# Patient Record
Sex: Male | Born: 1985 | Race: Black or African American | Hispanic: No | Marital: Single
Health system: Southern US, Community
[De-identification: ages and names within clinical notes are randomized; demographics above are authoritative.]

## PROBLEM LIST (undated history)

## (undated) DIAGNOSIS — F1910 Other psychoactive substance abuse, uncomplicated: Secondary | ICD-10-CM

## (undated) DIAGNOSIS — F419 Anxiety disorder, unspecified: Secondary | ICD-10-CM

## (undated) DIAGNOSIS — S060XAA Concussion with loss of consciousness status unknown, initial encounter: Secondary | ICD-10-CM

## (undated) DIAGNOSIS — F29 Unspecified psychosis not due to a substance or known physiological condition: Secondary | ICD-10-CM

## (undated) DIAGNOSIS — S43006A Unspecified dislocation of unspecified shoulder joint, initial encounter: Secondary | ICD-10-CM

---

## 2011-05-10 ENCOUNTER — Emergency Department (HOSPITAL_COMMUNITY)
Admission: EM | Admit: 2011-05-10 | Discharge: 2011-05-10 | Disposition: A | Discharge status: Home / Self Care | Payer: Self-pay | Attending: Emergency Medicine | Admitting: Emergency Medicine

## 2011-05-10 ENCOUNTER — Emergency Department (HOSPITAL_BASED_OUTPATIENT_CLINIC_OR_DEPARTMENT_OTHER)
Admission: EM | Admit: 2011-05-10 | Discharge: 2011-05-10 | Disposition: A | Discharge status: Home / Self Care | Payer: Self-pay | Attending: Emergency Medicine | Admitting: Emergency Medicine

## 2011-05-10 ENCOUNTER — Encounter (HOSPITAL_COMMUNITY): Payer: Self-pay | Admitting: Emergency Medicine

## 2011-05-10 ENCOUNTER — Emergency Department (HOSPITAL_COMMUNITY): Payer: Self-pay

## 2011-05-10 ENCOUNTER — Encounter (HOSPITAL_BASED_OUTPATIENT_CLINIC_OR_DEPARTMENT_OTHER): Payer: Self-pay | Admitting: *Deleted

## 2011-05-10 DIAGNOSIS — N50819 Testicular pain, unspecified: Secondary | ICD-10-CM

## 2011-05-10 DIAGNOSIS — N509 Disorder of male genital organs, unspecified: Secondary | ICD-10-CM | POA: Insufficient documentation

## 2011-05-10 LAB — URINALYSIS, MICROSCOPIC ONLY
Bilirubin Urine: NEGATIVE
Hgb urine dipstick: NEGATIVE
Ketones, ur: NEGATIVE mg/dL
Specific Gravity, Urine: 1.02 (ref 1.005–1.030)
Urobilinogen, UA: 1 mg/dL (ref 0.0–1.0)
pH: 6 (ref 5.0–8.0)

## 2011-05-10 MED ORDER — TRAMADOL HCL 50 MG PO TABS: 50.0000 mg | ORAL_TABLET | Freq: Four times a day (QID) | ORAL | Status: AC | PRN

## 2011-05-10 NOTE — ED Notes (Signed)
MD at bedside. 

## 2011-05-10 NOTE — ED Notes (Signed)
Called and spoke to [place], Charity fundraiser at [employer] to give pt report.

## 2011-05-10 NOTE — ED Notes (Signed)
Pt alert, presents from [facility], accepted per Dr Patria Mane, pt here for ultrasound, resp even unlabored, skin pwd

## 2011-05-10 NOTE — ED Provider Notes (Signed)
This patient was transferred from one of our affiliated facilities for further evaluation of testicular pain.  On arrival the patient defers request for additional analgesics, notes that he is the same.  The patient's ultrasound results did not demonstrate acute pathology, these findings were discussed with the patient, who continued to remain in stable condition.  Absent acute findings, with unremarkable vital signs, with the patient's description of the traumatic history to the onset of his complaints, the patient was discharged in stable condition with return precautions, urology followup.  Gerhard Munch, MD 05/10/11 [phone]

## 2011-05-10 NOTE — ED Notes (Signed)
Pt c/o left testicle pain after running into a table yesterday morning. Denies hematuria

## 2011-05-10 NOTE — ED Notes (Signed)
Patient returned from US.

## 2011-05-10 NOTE — ED Provider Notes (Signed)
History     CSN: [id number]  Arrival date & time 05/10/11  7829   First MD Initiated Contact with Patient 05/10/11 0622      Chief Complaint  Patient presents with  . Testicle Pain    (Consider location/radiation/quality/duration/timing/severity/associated sxs/prior treatment) Patient is a 26 y.o. male presenting with testicular pain. The history is provided by the patient. No language interpreter was used.  Testicle Pain This is a new problem. The current episode started yesterday. The problem occurs constantly. The problem has not changed since onset.Pertinent negatives include no abdominal pain. The symptoms are aggravated by nothing. The symptoms are relieved by nothing.  Pain states he ran into the corner of a table at 1130 yesterday and has had pain since.  Denies pain prior to event.  Denies dysuria.  No penile discharge no hematuria.  No abdominal pain.    History reviewed. No pertinent past medical history.  History reviewed. No pertinent past surgical history.  History reviewed. No pertinent family history.  History  Substance Use Topics  . Smoking status: Current Everyday Smoker  . Smokeless tobacco: Not on file  . Alcohol Use: Yes      Review of Systems  Constitutional: Negative for fever.  Gastrointestinal: Negative for abdominal pain.  Genitourinary: Positive for testicular pain. Negative for dysuria, discharge and penile pain.  All other systems reviewed and are negative.    Allergies  Review of patient's allergies indicates no known allergies.  Home Medications   Current Outpatient Rx  Name Route Sig Dispense Refill  . TRAMADOL HCL 50 MG PO TABS Oral Take 1 tablet (50 mg total) by mouth every 6 (six) hours as needed for pain. 15 tablet 0    BP 133/67  Pulse 68  Temp(Src) 97.8 F (36.6 C) (Oral)  Resp 16  Ht 5\' 10"  (1.778 m)  Wt 200 lb (90.719 kg)  BMI 28.70 kg/m2  SpO2 100%  Physical Exam  Constitutional: He is oriented to person, place,  and time. He appears well-developed and well-nourished. No distress.  HENT:  Head: Normocephalic and atraumatic.  Eyes: Conjunctivae are normal. Pupils are equal, round, and reactive to light.  Neck: Normal range of motion. Neck supple.  Cardiovascular: Normal rate and regular rhythm.   Pulmonary/Chest: Effort normal and breath sounds normal.  Abdominal: Soft. Bowel sounds are normal. There is no tenderness. Hernia confirmed negative in the right inguinal area and confirmed negative in the left inguinal area.  Genitourinary: Penis normal. Right testis shows no mass. Cremasteric reflex is not absent on the right side. Left testis shows tenderness. Left testis shows no mass and no swelling. Cremasteric reflex is not absent on the left side.  Neurological: He is alert and oriented to person, place, and time.  Skin: Skin is warm and dry.  Psychiatric: He has a normal mood and affect.    ED Course  Procedures (including critical care time)   Labs Reviewed  URINALYSIS, WITH MICROSCOPIC  URINE CULTURE   No results found.   1. Testicle pain       MDM  Case d/w Dr. Patria Mane will send POV to [facility] ED for [place]       Bernarr Longsworth K Latavion Halls-Rasch, MD 05/10/11 [phone]

## 2011-05-10 NOTE — Discharge Instructions (Signed)
Your evaluation today did not demonstrate acute changes in the testicles, such as infection, cancer, torsion.  If you develop any new, or concerning changes in your condition, please followup with the urologist or return to emergency department for additional evaluation.  Both ibuprofen and Tylenol are appropriate pain medications.  You may also use ice packs relief.

## 2011-05-11 LAB — URINE CULTURE
Colony Count: NO GROWTH
Culture  Setup Time: 201304181720

## 2011-08-13 ENCOUNTER — Emergency Department (HOSPITAL_BASED_OUTPATIENT_CLINIC_OR_DEPARTMENT_OTHER)
Admission: EM | Admit: 2011-08-13 | Discharge: 2011-08-13 | Disposition: A | Discharge status: Home / Self Care | Payer: Self-pay | Attending: Emergency Medicine | Admitting: Emergency Medicine

## 2011-08-13 DIAGNOSIS — K0889 Other specified disorders of teeth and supporting structures: Secondary | ICD-10-CM

## 2011-08-13 DIAGNOSIS — K029 Dental caries, unspecified: Secondary | ICD-10-CM

## 2011-08-13 NOTE — ED Notes (Signed)
See paper charting 

## 2011-08-13 NOTE — ED Provider Notes (Signed)
Documentation performed on downtime forms.    Carlisle Beers Courtnei Ruddell, MD 08/13/11 0230

## 2012-01-18 ENCOUNTER — Emergency Department (HOSPITAL_BASED_OUTPATIENT_CLINIC_OR_DEPARTMENT_OTHER)
Admission: EM | Admit: 2012-01-18 | Discharge: 2012-01-18 | Disposition: A | Discharge status: Home / Self Care | Payer: Self-pay | Attending: Emergency Medicine | Admitting: Emergency Medicine

## 2012-01-18 ENCOUNTER — Encounter (HOSPITAL_BASED_OUTPATIENT_CLINIC_OR_DEPARTMENT_OTHER): Payer: Self-pay | Admitting: Emergency Medicine

## 2012-01-18 DIAGNOSIS — R220 Localized swelling, mass and lump, head: Secondary | ICD-10-CM | POA: Insufficient documentation

## 2012-01-18 DIAGNOSIS — R1310 Dysphagia, unspecified: Secondary | ICD-10-CM | POA: Insufficient documentation

## 2012-01-18 DIAGNOSIS — K117 Disturbances of salivary secretion: Secondary | ICD-10-CM | POA: Insufficient documentation

## 2012-01-18 DIAGNOSIS — K029 Dental caries, unspecified: Secondary | ICD-10-CM | POA: Insufficient documentation

## 2012-01-18 DIAGNOSIS — F17200 Nicotine dependence, unspecified, uncomplicated: Secondary | ICD-10-CM | POA: Insufficient documentation

## 2012-01-18 MED ORDER — PENICILLIN V POTASSIUM 500 MG PO TABS: 500.0000 mg | ORAL_TABLET | Freq: Four times a day (QID) | ORAL | Status: AC

## 2012-01-18 MED ORDER — HYDROCODONE-ACETAMINOPHEN 5-500 MG PO TABS: 1.0000 | ORAL_TABLET | Freq: Four times a day (QID) | ORAL | Status: DC | PRN

## 2012-01-18 NOTE — ED Provider Notes (Signed)
History     CSN: [id number]  Arrival date & time 01/18/12  0620   None     No chief complaint on file.   (Consider location/radiation/quality/duration/timing/severity/associated sxs/prior treatment) Patient is a 26 y.o. male presenting with tooth pain. The history is provided by the patient.  Dental PainPrimary symptoms do not include oral bleeding, fever, angioedema or cough. The symptoms began 5 to 7 days ago. The symptoms are worsening. The symptoms are new. The symptoms occur constantly.  Additional symptoms include: dental sensitivity to temperature. Additional symptoms do not include: gum swelling, trouble swallowing, pain with swallowing and excessive salivation. Medical issues include: smoking.    No past medical history on file.  No past surgical history on file.  No family history on file.  History  Substance Use Topics  . Smoking status: Current Every Day Smoker  . Smokeless tobacco: Not on file  . Alcohol Use: Yes      Review of Systems  Constitutional: Negative for fever.  HENT: Negative for trouble swallowing.   Respiratory: Negative for cough.   All other systems reviewed and are negative.    Allergies  Review of patient's allergies indicates no known allergies.  Home Medications   Current Outpatient Rx  Name  Route  Sig  Dispense  Refill  . HYDROCODONE-ACETAMINOPHEN 5-500 MG PO TABS   Oral   Take 1 tablet by mouth every 6 (six) hours as needed for pain.   10 tablet   0   . PENICILLIN V POTASSIUM 500 MG PO TABS   Oral   Take 1 tablet (500 mg total) by mouth 4 (four) times daily.   40 tablet   0     BP 118/70  Pulse 90  Temp 98.3 F (36.8 C) (Oral)  Resp 16  Ht 5\' 11"  (1.803 m)  Wt 200 lb (90.719 kg)  BMI 27.89 kg/m2  SpO2 99%  Physical Exam  Constitutional: He is oriented to person, place, and time. He appears well-developed and well-nourished. No distress.  HENT:  Head: Normocephalic and atraumatic.  Right Ear: External ear  normal.  Left Ear: External ear normal.  Mouth/Throat: Uvula is midline and oropharynx is clear and moist. Dental caries present.    Eyes: Conjunctivae normal are normal. Pupils are equal, round, and reactive to light.  Neck: Normal range of motion. Neck supple.  Cardiovascular: Normal rate and regular rhythm.   Pulmonary/Chest: Effort normal and breath sounds normal. He has no wheezes. He has no rales.  Abdominal: Soft. Bowel sounds are normal.  Musculoskeletal: Normal range of motion.  Neurological: He is alert and oriented to person, place, and time.  Skin: Skin is warm and dry.  Psychiatric: He has a normal mood and affect.    ED Course  Procedures (including critical care time)  Labs Reviewed - No data to display No results found.   1. Dental caries       MDM  Will prescribe antibiotics and pain meds.  Must follow up with a dentist for ongoing care. Patient verbalizes understanding and agrees to follow up        Diane Mochizuki Smitty Cords, MD 01/18/12 0630

## 2012-01-18 NOTE — ED Notes (Signed)
Pt reports left lower jaw tooth pain onset Tuesday past

## 2013-02-16 ENCOUNTER — Emergency Department (HOSPITAL_BASED_OUTPATIENT_CLINIC_OR_DEPARTMENT_OTHER)
Admission: EM | Admit: 2013-02-16 | Discharge: 2013-02-16 | Disposition: A | Discharge status: Home / Self Care | Payer: Self-pay | Attending: Emergency Medicine | Admitting: Emergency Medicine

## 2013-02-16 ENCOUNTER — Emergency Department (HOSPITAL_BASED_OUTPATIENT_CLINIC_OR_DEPARTMENT_OTHER): Payer: Self-pay

## 2013-02-16 ENCOUNTER — Encounter (HOSPITAL_BASED_OUTPATIENT_CLINIC_OR_DEPARTMENT_OTHER): Payer: Self-pay | Admitting: Emergency Medicine

## 2013-02-16 DIAGNOSIS — Z791 Long term (current) use of non-steroidal anti-inflammatories (NSAID): Secondary | ICD-10-CM | POA: Insufficient documentation

## 2013-02-16 DIAGNOSIS — M25519 Pain in unspecified shoulder: Secondary | ICD-10-CM

## 2013-02-16 DIAGNOSIS — F17200 Nicotine dependence, unspecified, uncomplicated: Secondary | ICD-10-CM | POA: Insufficient documentation

## 2013-02-16 DIAGNOSIS — S43006A Unspecified dislocation of unspecified shoulder joint, initial encounter: Secondary | ICD-10-CM | POA: Insufficient documentation

## 2013-02-16 DIAGNOSIS — X500XXA Overexertion from strenuous movement or load, initial encounter: Secondary | ICD-10-CM | POA: Insufficient documentation

## 2013-02-16 DIAGNOSIS — Y9389 Activity, other specified: Secondary | ICD-10-CM | POA: Insufficient documentation

## 2013-02-16 DIAGNOSIS — Y929 Unspecified place or not applicable: Secondary | ICD-10-CM | POA: Insufficient documentation

## 2013-02-16 HISTORY — DX: Unspecified dislocation of unspecified shoulder joint, initial encounter: S43.006A

## 2013-02-16 MED ORDER — NAPROXEN 375 MG PO TABS: 375.0000 mg | ORAL_TABLET | Freq: Two times a day (BID) | ORAL | Status: DC

## 2013-02-16 MED ORDER — TRAMADOL HCL 50 MG PO TABS: 50.0000 mg | ORAL_TABLET | Freq: Four times a day (QID) | ORAL | Status: DC | PRN

## 2013-02-16 NOTE — ED Notes (Signed)
Pt amb to room 11 with quick steady gait in nad. Pt smiling, states that he dislocated his shoulder this morning, pt relocated it himself, pt states he is having pain, rates at 7/10, pt states he has chronic pain with this shoulder "every day". Pt states he is here for pain medication. Denies any other injuries or c/o.

## 2013-02-16 NOTE — Discharge Instructions (Signed)
Arthralgia  Your caregiver has diagnosed you as suffering from an arthralgia. Arthralgia means there is pain in a joint. This can come from many reasons including:  · Bruising the joint which causes soreness (inflammation) in the joint.  · Wear and tear on the joints which occur as we grow older (osteoarthritis).  · Overusing the joint.  · Various forms of arthritis.  · Infections of the joint.  Regardless of the cause of pain in your joint, most of these different pains respond to anti-inflammatory drugs and rest. The exception to this is when a joint is infected, and these cases are treated with antibiotics, if it is a bacterial infection.  HOME CARE INSTRUCTIONS   · Rest the injured area for as long as directed by your caregiver. Then slowly start using the joint as directed by your caregiver and as the pain allows. Crutches as directed may be useful if the ankles, knees or hips are involved. If the knee was splinted or casted, continue use and care as directed. If an stretchy or elastic wrapping bandage has been applied today, it should be removed and re-applied every 3 to 4 hours. It should not be applied tightly, but firmly enough to keep swelling down. Watch toes and feet for swelling, bluish discoloration, coldness, numbness or excessive pain. If any of these problems (symptoms) occur, remove the ace bandage and re-apply more loosely. If these symptoms persist, contact your caregiver or return to this location.  · For the first 24 hours, keep the injured extremity elevated on pillows while lying down.  · Apply ice for 15-20 minutes to the sore joint every couple hours while awake for the first half day. Then 03-04 times per day for the first 48 hours. Put the ice in a plastic bag and place a towel between the bag of ice and your skin.  · Wear any splinting, casting, elastic bandage applications, or slings as instructed.  · Only take over-the-counter or prescription medicines for pain, discomfort, or fever as  directed by your caregiver. Do not use aspirin immediately after the injury unless instructed by your physician. Aspirin can cause increased bleeding and bruising of the tissues.  · If you were given crutches, continue to use them as instructed and do not resume weight bearing on the sore joint until instructed.  Persistent pain and inability to use the sore joint as directed for more than 2 to 3 days are warning signs indicating that you should see a caregiver for a follow-up visit as soon as possible. Initially, a hairline fracture (break in bone) may not be evident on X-rays. Persistent pain and swelling indicate that further evaluation, non-weight bearing or use of the joint (use of crutches or slings as instructed), or further X-rays are indicated. X-rays may sometimes not show a small fracture until a week or 10 days later. Make a follow-up appointment with your own caregiver or one to whom we have referred you. A radiologist (specialist in reading X-rays) may read your X-rays. Make sure you know how you are to obtain your X-ray results. Do not assume everything is normal if you do not hear from us.  SEEK MEDICAL CARE IF:  Bruising, swelling, or pain increases.  SEEK IMMEDIATE MEDICAL CARE IF:   · Your fingers or toes are numb or blue.  · The pain is not responding to medications and continues to stay the same or get worse.  · The pain in your joint becomes severe.  · You   develop a fever over 102° F (38.9° C).  · It becomes impossible to move or use the joint.  MAKE SURE YOU:   · Understand these instructions.  · Will watch your condition.  · Will get help right away if you are not doing well or get worse.  Document Released: 01/08/2005 Document Revised: 04/02/2011 Document Reviewed: 08/27/2007  [employer]® Patient Information ©2014 [employer].

## 2013-02-16 NOTE — ED Notes (Signed)
MD at bedside, patient preparing for discharge. 

## 2013-02-16 NOTE — ED Provider Notes (Signed)
CSN: [id number]     Arrival date & time 02/16/13  7829 History   First MD Initiated Contact with Patient 02/16/13 [phone]     Chief Complaint  Patient presents with  . Shoulder Pain   (Consider location/radiation/quality/duration/timing/severity/associated sxs/prior Treatment) HPI Comments: Patient presents with right shoulder pain. 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 The last episode before today was about a year ago. He states this morning he was reaching into the back of his car and it dislocated. He was able to relocate the shoulder himself. He's had some ongoing pain since that time his right shoulder. He states it's been happening periodically since he was in high school. He's not been following up with an orthopedic surgeon. He denies any numbness in his hand. He's had any other injuries.  Patient is a 27 y.o. male presenting with shoulder pain.  Shoulder Pain Pertinent negatives include no headaches.    Past Medical History  Diagnosis Date  . Shoulder dislocation    History reviewed. No pertinent past surgical history. History reviewed. No pertinent family history. History  Substance Use Topics  . Smoking status: Current Every Day Smoker  . Smokeless tobacco: Not on file  . Alcohol Use: Yes    Review of Systems  Constitutional: Negative for fever.  Gastrointestinal: Negative for nausea and vomiting.  Musculoskeletal: Positive for arthralgias and joint swelling. Negative for back pain and neck pain.  Skin: Negative for wound.  Neurological: Negative for weakness, numbness and headaches.    Allergies  Review of patient's allergies indicates no known allergies.  Home Medications   Current Outpatient Rx  Name  Route  Sig  Dispense  Refill  . HYDROcodone-acetaminophen (VICODIN) 5-500 MG per tablet   Oral   Take 1 tablet by mouth every 6 (six) hours as needed for pain.   10 tablet   0   . naproxen (NAPROSYN) 375 MG tablet    Oral   Take 1 tablet (375 mg total) by mouth 2 (two) times daily.   20 tablet   0   . traMADol (ULTRAM) 50 MG tablet   Oral   Take 1 tablet (50 mg total) by mouth every 6 (six) hours as needed.   15 tablet   0    BP 125/77  Pulse 69  Temp(Src) 98 F (36.7 C) (Oral)  Resp 18  Ht 5\' 11"  (1.803 m)  Wt 210 lb (95.255 kg)  BMI 29.30 kg/m2  SpO2 98% Physical Exam  Constitutional: He is oriented to person, place, and time. He appears well-developed and well-nourished.  HENT:  Head: Normocephalic and atraumatic.  Neck: Normal range of motion. Neck supple.  Cardiovascular: Normal rate.   Pulmonary/Chest: Effort normal.  Musculoskeletal: He exhibits edema and tenderness.  Mild swelling to right shoulder.  Moderate pain on ROM/palpation of shoulder.  Clinically seems that shoulder is well located.  Normal sensation, motor function in hand.  No pain to elbow.  Neurological: He is alert and oriented to person, place, and time.  Skin: Skin is warm and dry.  Psychiatric: He has a normal mood and affect.    ED Course  Procedures (including critical care time) Labs Review Labs Reviewed - No data to display Imaging Review Dg Shoulder Right  02/16/2013   CLINICAL DATA:  Shoulder dislocation  EXAM: RIGHT SHOULDER - 2+ VIEW  COMPARISON:  None.  FINDINGS: Glenohumeral joint is intact. No evidence of scapular fracture or humeral fracture. The acromioclavicular  joint is intact.  IMPRESSION: No acute osseous abnormality.   Electronically Signed   By: Genevive BiStewart  Edmunds M.D.   On: 02/16/2013 07:49    EKG Interpretation   None       MDM   1. Shoulder pain    No evidence of ongoing dislocation or bony injury on x-ray. Patient is neurologically intact. He was given a prescription for Naprosyn and Ultram. He was placed in a shoulder sling. He was given a referral to follow up with orthopedics.    Rolan BuccoMelanie Carvel Huskins, MD 02/16/13 [phone]

## 2013-11-15 ENCOUNTER — Encounter (HOSPITAL_BASED_OUTPATIENT_CLINIC_OR_DEPARTMENT_OTHER): Payer: Self-pay | Admitting: Emergency Medicine

## 2013-11-15 ENCOUNTER — Emergency Department (HOSPITAL_BASED_OUTPATIENT_CLINIC_OR_DEPARTMENT_OTHER)
Admission: EM | Admit: 2013-11-15 | Discharge: 2013-11-15 | Disposition: A | Discharge status: Home / Self Care | Payer: Self-pay | Attending: Emergency Medicine | Admitting: Emergency Medicine

## 2013-11-15 DIAGNOSIS — Z87828 Personal history of other (healed) physical injury and trauma: Secondary | ICD-10-CM | POA: Insufficient documentation

## 2013-11-15 DIAGNOSIS — K029 Dental caries, unspecified: Secondary | ICD-10-CM | POA: Insufficient documentation

## 2013-11-15 DIAGNOSIS — Z791 Long term (current) use of non-steroidal anti-inflammatories (NSAID): Secondary | ICD-10-CM | POA: Insufficient documentation

## 2013-11-15 DIAGNOSIS — Z720 Tobacco use: Secondary | ICD-10-CM | POA: Insufficient documentation

## 2013-11-15 MED ORDER — TRAMADOL HCL 50 MG PO TABS: 50.0000 mg | ORAL_TABLET | Freq: Four times a day (QID) | ORAL | Status: DC | PRN

## 2013-11-15 MED ORDER — AMOXICILLIN 500 MG PO CAPS: 500.0000 mg | ORAL_CAPSULE | Freq: Three times a day (TID) | ORAL | Status: DC

## 2013-11-15 NOTE — ED Notes (Signed)
Patient states he has left dental pain

## 2013-11-15 NOTE — Discharge Instructions (Signed)
Dental Pain °A tooth ache may be caused by cavities (tooth decay). Cavities expose the nerve of the tooth to air and hot or cold temperatures. It may come from an infection or abscess (also called a boil or furuncle) around your tooth. It is also often caused by dental caries (tooth decay). This causes the pain you are having. °DIAGNOSIS  °Your caregiver can diagnose this problem by exam. °TREATMENT  °· If caused by an infection, it may be treated with medications which kill germs (antibiotics) and pain medications as prescribed by your caregiver. Take medications as directed. °· Only take over-the-counter or prescription medicines for pain, discomfort, or fever as directed by your caregiver. °· Whether the tooth ache today is caused by infection or dental disease, you should see your dentist as soon as possible for further care. °SEEK MEDICAL CARE IF: °The exam and treatment you received today has been provided on an emergency basis only. This is not a substitute for complete medical or dental care. If your problem worsens or new problems (symptoms) appear, and you are unable to meet with your dentist, call or return to this location. °SEEK IMMEDIATE MEDICAL CARE IF:  °· You have a fever. °· You develop redness and swelling of your face, jaw, or neck. °· You are unable to open your mouth. °· You have severe pain uncontrolled by pain medicine. °MAKE SURE YOU:  °· Understand these instructions. °· Will watch your condition. °· Will get help right away if you are not doing well or get worse. °Document Released: 01/08/2005 Document Revised: 04/02/2011 Document Reviewed: 08/27/2007 °ExitCare® Patient Information ©2015 [employer]. This information is not intended to replace advice given to you by your health care provider. Make sure you discuss any questions you have with your health care provider. ° °Emergency Department Resource Guide °1) Find a Doctor and Pay Out of Pocket °Although you won't have to find out who  is covered by your insurance plan, it is a good idea to ask around and get recommendations. You will then need to call the office and see if the doctor you have chosen will accept you as a new patient and what types of options they offer for patients who are self-pay. Some doctors offer discounts or will set up payment plans for their patients who do not have insurance, but you will need to ask so you aren't surprised when you get to your appointment. ° °2) Contact Your Local Health Department °Not all health departments have doctors that can see patients for sick visits, but many do, so it is worth a call to see if yours does. If you don't know where your local health department is, you can check in your phone book. The CDC also has a tool to help you locate your state's health department, and many state websites also have listings of all of their local health departments. ° °3) Find a Walk-in Clinic °If your illness is not likely to be very severe or complicated, you may want to try a walk in clinic. These are popping up all over the country in pharmacies, drugstores, and shopping centers. They're usually staffed by nurse practitioners or physician assistants that have been trained to treat common illnesses and complaints. They're usually fairly quick and inexpensive. However, if you have serious medical issues or chronic medical problems, these are probably not your best option. ° °No Primary Care Doctor: °- Call [facility] at  832-8000 - they can help you locate a primary   care doctor that  accepts your insurance, provides certain services, etc. °- Physician Referral Service- 1-800-533-3463 ° °Chronic Pain Problems: °Organization         Address  Phone   Notes  °[facility]  (336) 297-2271 Patients need to be referred by their primary care doctor.  ° °Medication Assistance: °Organization         Address  Phone   Notes  °[place] [facility] [address]  [address] °[place], [place] [address] (336) 641-8030 --Must be a resident of [place] °-- Must have NO insurance coverage whatsoever (no Medicaid/ Medicare, etc.) °-- The pt. MUST have a primary care doctor that directs their care regularly and follows them in the community °  °[facility]  (866) 331-1348   °[employer]  (888) 892-1162   ° °Agencies that provide inexpensive medical care: °Organization         Address  Phone   Notes  °[facility]  (336) 832-8035   °[facility]    (336) 832-7272   °[facility] [address] °[place], [place] [address] (336) 832-4777   °[facility] [address], °[place] (336) 271-4999   °[facility]    (336) 373-0678   °[facility]    (336) 272-1050   °[facility] ° [address], [place] Phone:  (336) 832-4444, Fax:  (336) 832-4440 Hours of Operation:  9 am - 6 pm, M-F.  Also accepts Medicaid/Medicare and self-pay.  °[facility] ° [address], [place] Phone: (336) 832-3150, Fax: (336) 832-3151. Hours of Operation:  8:30 am - 5:30 pm, M-F.  Also accepts Medicaid and self-pay.  °[facility] [place] [address], [place] Phone: (336) 878-6027   °[facility] [address], [place], [place] (336)723-1848, Ext. 123 Mondays & Thursdays: 7-9 AM.  First 15 patients are seen on a first come, first serve basis. °  ° °Medicaid-accepting [place] Providers: ° °Organization         Address  Phone   Notes  °[facility] [address], [place] (336) 641-2100 Also accepts self-pay patients.  °[facility] [address], [place] ° (336) 856-9996   °[facility] [address] [place] [address], [place] (336) 288-8857   °[facility] [address] [place] [address], [place] (336) 299-7000   °Veita Bland [address], [place]  ° (336) 373-1557 Only accepts [place] Access Medicaid patients after they have their name applied to their card.  ° °Self-Pay (no insurance) in [place]: ° °Organization         Address  Phone   Notes  °Sickle Cell Patients, [facility] [address], [place] (336) 832-1970   °[facility] [address], [place] (336) 832-4400   °[facility] ° [address], [place] (336) 992-4800   °[facility]/Dr. Osei-Bonsu ° [address] [place] [address], [place] or [address], [place] (336) 841-8500 Phone number for both [place] and [place] locations is the same.  °[facility] [address], [place] (336) 299-0000   °[facility]  [address] [place] [address],  or [address] (336) 852-7530 °(336) 878-2260   °  [facility] [address], [place] (336) 350-1642, phone; (336) 294-5005, fax Sees patients 1st and 3rd Saturday of every month.  Must not qualify for public or private insurance (i.e. Medicaid, Medicare, [place] Health Choice, Veterans' Benefits) • Household income should be no more than 200% of the poverty level •The clinic cannot treat you if you are pregnant or think you are pregnant • Sexually transmitted diseases are not treated at the clinic.  ° ° °Dental Care: °Organization         Address  Phone  Notes  °[facility] [facility] [address], [place] (336) 641-6152 Accepts children up to age 21 who are enrolled in Medicaid or [place] Health Choice; pregnant women with a Medicaid card; and children who have applied for Medicaid or [place] Health Choice, but were declined, whose parents can pay a reduced fee at time of service.  °[facility] [place]  [address], [place] (336) 641-7733 Accepts children up to age 21 who are  enrolled in Medicaid or [place] Health Choice; pregnant women with a Medicaid card; and children who have applied for Medicaid or [place] Health Choice, but were declined, whose parents can pay a reduced fee at time of service.  °[facility] ° [address], [place] (336) 641-4533 Patients are seen by appointment only. Walk-ins are not accepted. [facility] will see patients 18 years of age and older. °Monday - Tuesday (8am-5pm) °Most Wednesdays (8:30-5pm) °$30 per visit, cash only  °[facility] ° [address], [place] (336) 641-4533 Patients are seen by appointment only. Walk-ins are not accepted. [facility] will see patients 18 years of age and older. °One Wednesday Evening (Monthly: Volunteer Based).  $30 per visit, cash only  °[facility]  (919) 537-3737 for adults; Children under age 4, call Graduate Pediatric Dentistry at ([phone]) [phone]. Children aged 4-14, please call (919) [phone] to request a pediatric application. ° Dental services are provided in all areas of dental care including fillings, crowns and bridges, complete and partial dentures, implants, gum treatment, root canals, and extractions. Preventive care is also provided. Treatment is provided to both adults and children. °Patients are selected via a lottery and there is often a waiting list. °  °[facility] [address], °[place] ° (336) 763-8833 [email or web address] °  °[facility] [address], [place], [place] (336)723-1848, Ext. 123 Second and Fourth Thursday of each month, opens at 6:30 AM; Clinic ends at 9 AM.  Patients are seen on a first-come first-served basis, and a limited number are seen during each clinic.  ° °[facility] ° [address], [place], [place] (336) 723-7904   Eligibility Requirements °You must have lived in [place], [place], or [place] counties for at least the last three months. °  You  cannot be eligible for state or federal sponsored healthcare insurance, including Veterans Administration, Medicaid, or Medicare. °  You generally cannot be eligible for healthcare insurance through your employer.  °  How to apply: °Eligibility screenings are held every Tuesday and Wednesday afternoon from 1:00 pm until 4:00 pm. You do not need an appointment for the interview!  °[facility] [address], [place], [place] 336-631-2330   °[facility]  336-342-8273   °[facility]  336-703-3100   ° [facility]  336-570-6415   ° °Behavioral Health Resources in the Community: °Intensive Outpatient Programs °Organization         Address  Phone  Notes  °[facility] [address], [place], [place] 336-878-6098   °[facility] [address], [place], [place] 336-832-9800   °[facility] [address], [place], [place] ° 336-882-2125   °[facility] [address],  °[place], [place] 1-800-853-5163 or 336-641-4981   °Substance Abuse Resources °Organization         Address  Phone  Notes  °[facility]  336-882-2125   °[facility]  336-784-9470   °[facility]  336-285-9073   °[facility]  336-845-3988   °Residential & Outpatient Substance Abuse Program  1-800-659-3381   °Psychological Services °Organization         Address  Phone  Notes  °[facility]  336- 832-9600   °[facility]  336- 378-7881   °[facility] [address], [place] 1-800-853-5163 or 336-641-4981   ° °Mobile Crisis Teams °Organization         Address  Phone  Notes  °[facility], Mobile Crisis Care Unit  1-877-626-1772   °Assertive °[facility] ° [address] [place], [place] 336-834-9664   °Sharon DeEsch [address] °[place] 336-554-5454   ° °Self-Help/Support  Groups °Organization         Address  Phone             Notes  °[facility] - variety of support groups  336- 373-1402 Call for more information  °Narcotics Anonymous (NA), [facility] [address], °[place]  2 meetings at this location  ° °Residential Treatment Programs °Organization         Address  Phone  Notes  °[facility] [address],    °[place]  1-866-801-8205   °[facility] ° [address], [place], [place] 704-293-8524   °[facility] [address], [place] 336-845-3988 Admissions: 8am-3pm M-F  °[facility] [address]    °[place], [place] 336-841-1104   °[facility] [address], [place], [place] 336-379-7146   °[facility] [address]  °[place], [place] 336-285-9073   °[facility] Intensive Outpatient [address], [place], [place] 336-852-3033   °[facility] [address]  °[place], [place] 1-877-615-2722 or 336-784-9470   °[facility] [address] [place], [place] 336-227-7417 Accepts Medicaid  °[facility] [address]  °[place] 1-800-659-3381 Substance Abuse/Addiction Treatment  ° °[place] Behavioral Health Resources °Organization         Address  Phone  Notes  °[facility]  (888) 581-9988   °Julie Brannon, PhD [address] [place], [place]   (336) 349-5553 or (336) 951-0000   °[facility]   [address] °[place], [place] (336) 349-4454   °[facility] [address], [place], [place] (336) 342-8316 Insurance/Medicaid/sponsorship through [facility]  °[facility] [address]                                    , [place] (336) 342-8316 Therapy/tele-psych/case  °[facility]   [address]  ° [place], [place] (336) 349-2233    °Dr. Arfeen  (336) 349-4544   °[facility] [place]  [employer] [facility] [place] [facility] 1) [address], [place] °2) [address], [place] °3)  [address], [place] (336) 349-3220 °(336) 342-7768 ° °(336) 342-8140   °[place] [facility] (336) 342-1394 or (336) 342-3537 (After Hours)    ° ° ° °

## 2013-11-15 NOTE — ED Provider Notes (Signed)
CSN: [id number]     Arrival date & time 11/15/13  1125 History   First MD Initiated Contact with Patient 11/15/13 1212     Chief Complaint  Patient presents with  . Dental Pain     (Consider location/radiation/quality/duration/timing/severity/associated sxs/prior Treatment) HPI Comments: Patient presents with a 2 day history of worsening pain to his left upper tooth. He denies any prior histories of similar discomfort there. He denies any facial swelling. There is no fevers or vomiting. He's been taking ibuprofen without relief.  Patient is a 28 y.o. male presenting with tooth pain.  Dental Pain Associated symptoms: no facial swelling, no fever and no headaches     Past Medical History  Diagnosis Date  . Shoulder dislocation    History reviewed. No pertinent past surgical history. No family history on file. History  Substance Use Topics  . Smoking status: Current Every Day Smoker  . Smokeless tobacco: Not on file  . Alcohol Use: Yes    Review of Systems  Constitutional: Negative for fever.  HENT: Positive for dental problem. Negative for facial swelling.   Gastrointestinal: Negative for nausea and vomiting.  Skin: Negative for wound.  Neurological: Negative for headaches.      Allergies  Review of patient's allergies indicates no known allergies.  Home Medications   Prior to Admission medications   Medication Sig Start Date End Date Taking? Authorizing Provider  amoxicillin (AMOXIL) 500 MG capsule Take 1 capsule (500 mg total) by mouth 3 (three) times daily. 11/15/13   Rolan BuccoMelanie Jalacia Mattila, MD  HYDROcodone-acetaminophen (VICODIN) 5-500 MG per tablet Take 1 tablet by mouth every 6 (six) hours as needed for pain. 01/18/12   April K Palumbo-Rasch, MD  naproxen (NAPROSYN) 375 MG tablet Take 1 tablet (375 mg total) by mouth 2 (two) times daily. 02/16/13   Rolan BuccoMelanie Roman Sandall, MD  traMADol (ULTRAM) 50 MG tablet Take 1 tablet (50 mg total) by mouth every 6 (six) hours as needed. 02/16/13    Rolan BuccoMelanie Alesandro Stueve, MD  traMADol (ULTRAM) 50 MG tablet Take 1 tablet (50 mg total) by mouth every 6 (six) hours as needed. 11/15/13   Rolan BuccoMelanie Bellina Tokarczyk, MD   BP 133/83  Pulse 69  Temp(Src) 98.3 F (36.8 C) (Oral)  Resp 18  Ht 5\' 11"  (1.803 m)  Wt 215 lb (97.523 kg)  BMI 30.00 kg/m2  SpO2 98% Physical Exam  Constitutional: He is oriented to person, place, and time. He appears well-developed and well-nourished.  HENT:  Positive tenderness to the left upper back molar. There is no facial swelling. No trismus. No induration or fluctuance around the tooth.  Cardiovascular: Normal rate.   Pulmonary/Chest: Effort normal.  Neurological: He is alert and oriented to person, place, and time.  Skin: Skin is warm and dry.    ED Course  Procedures (including critical care time) Labs Review Labs Reviewed - No data to display  Imaging Review No results found.   EKG Interpretation None      MDM   Final diagnoses:  Dental caries    Patient was started on amoxicillin and tramadol for pain. He was given a resource guide for possible outpatient follow-up with a dentist.    Rolan BuccoMelanie Margit Batte, MD 11/15/13 1251

## 2014-09-28 ENCOUNTER — Encounter (HOSPITAL_BASED_OUTPATIENT_CLINIC_OR_DEPARTMENT_OTHER): Payer: Self-pay | Admitting: Emergency Medicine

## 2014-09-28 ENCOUNTER — Emergency Department (HOSPITAL_BASED_OUTPATIENT_CLINIC_OR_DEPARTMENT_OTHER)
Admission: EM | Admit: 2014-09-28 | Discharge: 2014-09-28 | Disposition: A | Discharge status: Home / Self Care | Payer: Self-pay | Attending: Emergency Medicine | Admitting: Emergency Medicine

## 2014-09-28 DIAGNOSIS — Z720 Tobacco use: Secondary | ICD-10-CM | POA: Insufficient documentation

## 2014-09-28 DIAGNOSIS — M545 Low back pain: Secondary | ICD-10-CM | POA: Insufficient documentation

## 2014-09-28 DIAGNOSIS — R531 Weakness: Secondary | ICD-10-CM | POA: Insufficient documentation

## 2014-09-28 DIAGNOSIS — K529 Noninfective gastroenteritis and colitis, unspecified: Secondary | ICD-10-CM | POA: Insufficient documentation

## 2014-09-28 DIAGNOSIS — Z791 Long term (current) use of non-steroidal anti-inflammatories (NSAID): Secondary | ICD-10-CM | POA: Insufficient documentation

## 2014-09-28 DIAGNOSIS — Z792 Long term (current) use of antibiotics: Secondary | ICD-10-CM | POA: Insufficient documentation

## 2014-09-28 DIAGNOSIS — Z87828 Personal history of other (healed) physical injury and trauma: Secondary | ICD-10-CM | POA: Insufficient documentation

## 2014-09-28 LAB — CBC WITH DIFFERENTIAL/PLATELET
BASOS ABS: 0 10*3/uL (ref 0.0–0.1)
BASOS PCT: 1 % (ref 0–1)
EOS ABS: 0.1 10*3/uL (ref 0.0–0.7)
Eosinophils Relative: 2 % (ref 0–5)
HEMATOCRIT: 45.6 % (ref 39.0–52.0)
HEMOGLOBIN: 16 g/dL (ref 13.0–17.0)
Lymphocytes Relative: 37 % (ref 12–46)
Lymphs Abs: 2.9 10*3/uL (ref 0.7–4.0)
MCH: 31.5 pg (ref 26.0–34.0)
MCHC: 35.1 g/dL (ref 30.0–36.0)
MCV: 89.8 fL (ref 78.0–100.0)
Monocytes Absolute: 0.8 10*3/uL (ref 0.1–1.0)
Monocytes Relative: 11 % (ref 3–12)
NEUTROS ABS: 3.9 10*3/uL (ref 1.7–7.7)
NEUTROS PCT: 49 % (ref 43–77)
Platelets: 304 10*3/uL (ref 150–400)
RBC: 5.08 MIL/uL (ref 4.22–5.81)
RDW: 11.8 % (ref 11.5–15.5)
WBC: 7.8 10*3/uL (ref 4.0–10.5)

## 2014-09-28 LAB — URINE MICROSCOPIC-ADD ON

## 2014-09-28 LAB — COMPREHENSIVE METABOLIC PANEL
ALT: 17 U/L (ref 17–63)
AST: 15 U/L (ref 15–41)
Albumin: 4.4 g/dL (ref 3.5–5.0)
Alkaline Phosphatase: 40 U/L (ref 38–126)
Anion gap: 9 (ref 5–15)
BUN: 11 mg/dL (ref 6–20)
CHLORIDE: 104 mmol/L (ref 101–111)
CO2: 25 mmol/L (ref 22–32)
CREATININE: 1.19 mg/dL (ref 0.61–1.24)
Calcium: 9.6 mg/dL (ref 8.9–10.3)
GFR calc Af Amer: >60 mL/min (ref >60)
Glucose, Bld: 101 mg/dL — ABNORMAL HIGH (ref 65–99)
Potassium: 3.7 mmol/L (ref 3.5–5.1)
Sodium: 138 mmol/L (ref 135–145)
Total Bilirubin: 0.9 mg/dL (ref 0.3–1.2)
Total Protein: 7.7 g/dL (ref 6.5–8.1)

## 2014-09-28 LAB — URINALYSIS, ROUTINE W REFLEX MICROSCOPIC
BILIRUBIN URINE: NEGATIVE
GLUCOSE, UA: NEGATIVE mg/dL
Hgb urine dipstick: NEGATIVE
KETONES UR: 15 mg/dL — AB
Nitrite: NEGATIVE
PROTEIN: NEGATIVE mg/dL
SPECIFIC GRAVITY, URINE: 1.026 (ref 1.005–1.030)
Urobilinogen, UA: 1 mg/dL (ref 0.0–1.0)
pH: 5.5 (ref 5.0–8.0)

## 2014-09-28 LAB — LIPASE, BLOOD: LIPASE: 26 U/L (ref 22–51)

## 2014-09-28 MED ORDER — ONDANSETRON 8 MG PO TBDP: 8.0000 mg | ORAL_TABLET | Freq: Three times a day (TID) | ORAL | Status: AC | PRN

## 2014-09-28 MED ORDER — ONDANSETRON 4 MG PO TBDP
4.0000 mg | ORAL_TABLET | Freq: Once | ORAL | Status: AC | PRN
  Administered 2014-09-28: 4 mg via ORAL
  Filled 2014-09-28: qty 1

## 2014-09-28 NOTE — ED Notes (Signed)
Patient is tolerating ensure and coffee in triage, reports that this makes him nauseated.

## 2014-09-28 NOTE — ED Notes (Signed)
MD at bedside. 

## 2014-09-28 NOTE — ED Provider Notes (Signed)
CSN: [id number]     Arrival date & time 09/28/14  0017 History   First MD Initiated Contact with Patient 09/28/14 0214     Chief Complaint  Patient presents with  . Fatigue     (Consider location/radiation/quality/duration/timing/severity/associated sxs/prior Treatment) HPI  This is a 29 year old male who developed nausea, vomiting and diarrhea 4 days ago. He has not vomited or had diarrhea since the first day but continues to have no appetite and feels too nauseated to eat. He is only been able to tolerate Ensure. He is complaining of low back pain, generalized weakness and fatigue. He denies fever. Per nursing staff he has been lying in bed with a blanket over his head. He was given a liter of normal saline and 4 milligrams of Zofran prior to my evaluation with partial improvement in the symptoms. He states his abdomen hurts because he is presently hungry.  Past Medical History  Diagnosis Date  . Shoulder dislocation    History reviewed. No pertinent past surgical history. History reviewed. No pertinent family history. Social History  Substance Use Topics  . Smoking status: Current Every Day Smoker  . Smokeless tobacco: None  . Alcohol Use: Yes    Review of Systems  All other systems reviewed and are negative.  Allergies  Review of patient's allergies indicates no known allergies.  Home Medications   Prior to Admission medications   Medication Sig Start Date End Date Taking? Authorizing Provider  amoxicillin (AMOXIL) 500 MG capsule Take 1 capsule (500 mg total) by mouth 3 (three) times daily. 11/15/13   Rolan Bucco, MD  HYDROcodone-acetaminophen (VICODIN) 5-500 MG per tablet Take 1 tablet by mouth every 6 (six) hours as needed for pain. 01/18/12   April Palumbo, MD  naproxen (NAPROSYN) 375 MG tablet Take 1 tablet (375 mg total) by mouth 2 (two) times daily. 02/16/13   Rolan Bucco, MD  traMADol (ULTRAM) 50 MG tablet Take 1 tablet (50 mg total) by mouth every 6 (six) hours as  needed. 02/16/13   Rolan Bucco, MD  traMADol (ULTRAM) 50 MG tablet Take 1 tablet (50 mg total) by mouth every 6 (six) hours as needed. 11/15/13   Rolan Bucco, MD   BP 132/82 mmHg  Pulse 59  Temp(Src) 98.9 F (37.2 C) (Oral)  Resp 16  Ht 5\' 11"  (1.803 m)  Wt 220 lb (99.791 kg)  BMI 30.70 kg/m2  SpO2 99%   Physical Exam General: Well-developed, well-nourished male in no acute distress; appearance consistent with age of record HENT: normocephalic; atraumatic; mucous membranes moist Eyes: pupils equal, round and reactive to light; extraocular muscles intact Neck: supple Heart: regular rate and rhythm Lungs: clear to auscultation bilaterally Abdomen: soft; nondistended; nontender; no masses or hepatosplenomegaly; bowel sounds present Extremities: No deformity; full range of motion; pulses normal Neurologic: Awake, alert and oriented; motor function intact in all extremities and symmetric; no facial droop Skin: Warm and dry Psychiatric: Flat affect; poverty of speech    ED Course  Procedures (including critical care time)   MDM   Nursing notes and vitals signs, including pulse oximetry, reviewed.  Summary of this visit's results, reviewed by myself:  Labs:  Results for orders placed or performed during the hospital encounter of 09/28/14 (from the past 24 hour(s))  Lipase, blood     Status: None   Collection Time: 09/28/14 12:35 AM  Result Value Ref Range   Lipase 26 22 - 51 U/L  Comprehensive metabolic panel     Status: Abnormal  Collection Time: 09/28/14 12:35 AM  Result Value Ref Range   Sodium 138 135 - 145 mmol/L   Potassium 3.7 3.5 - 5.1 mmol/L   Chloride 104 101 - 111 mmol/L   CO2 25 22 - 32 mmol/L   Glucose, Bld 101 (H) 65 - 99 mg/dL   BUN 11 6 - 20 mg/dL   Creatinine, Ser 1.30 0.61 - 1.24 mg/dL   Calcium 9.6 8.9 - 86.5 mg/dL   Total Protein 7.7 6.5 - 8.1 g/dL   Albumin 4.4 3.5 - 5.0 g/dL   AST 15 15 - 41 U/L   ALT 17 17 - 63 U/L   Alkaline Phosphatase  40 38 - 126 U/L   Total Bilirubin 0.9 0.3 - 1.2 mg/dL   GFR calc non Af Amer >60 >60 mL/min   GFR calc Af Amer >60 >60 mL/min   Anion gap 9 5 - 15  Urinalysis, Routine w reflex microscopic (not at [facility])     Status: Abnormal   Collection Time: 09/28/14 12:35 AM  Result Value Ref Range   Color, Urine AMBER (A) YELLOW   APPearance CLEAR CLEAR   Specific Gravity, Urine 1.026 1.005 - 1.030   pH 5.5 5.0 - 8.0   Glucose, UA NEGATIVE NEGATIVE mg/dL   Hgb urine dipstick NEGATIVE NEGATIVE   Bilirubin Urine NEGATIVE NEGATIVE   Ketones, ur 15 (A) NEGATIVE mg/dL   Protein, ur NEGATIVE NEGATIVE mg/dL   Urobilinogen, UA 1.0 0.0 - 1.0 mg/dL   Nitrite NEGATIVE NEGATIVE   Leukocytes, UA SMALL (A) NEGATIVE  CBC with Differential     Status: None   Collection Time: 09/28/14 12:35 AM  Result Value Ref Range   WBC 7.8 4.0 - 10.5 K/uL   RBC 5.08 4.22 - 5.81 MIL/uL   Hemoglobin 16.0 13.0 - 17.0 g/dL   HCT 78.4 69.6 - 29.5 %   MCV 89.8 78.0 - 100.0 fL   MCH 31.5 26.0 - 34.0 pg   MCHC 35.1 30.0 - 36.0 g/dL   RDW 28.4 13.2 - 44.0 %   Platelets 304 150 - 400 K/uL   Neutrophils Relative % 49 43 - 77 %   Neutro Abs 3.9 1.7 - 7.7 K/uL   Lymphocytes Relative 37 12 - 46 %   Lymphs Abs 2.9 0.7 - 4.0 K/uL   Monocytes Relative 11 3 - 12 %   Monocytes Absolute 0.8 0.1 - 1.0 K/uL   Eosinophils Relative 2 0 - 5 %   Eosinophils Absolute 0.1 0.0 - 0.7 K/uL   Basophils Relative 1 0 - 1 %   Basophils Absolute 0.0 0.0 - 0.1 K/uL  Urine microscopic-add on     Status: None   Collection Time: 09/28/14 12:35 AM  Result Value Ref Range   Squamous Epithelial / LPF RARE RARE   WBC, UA 3-6 <3 WBC/hpf   RBC / HPF 0-2 <3 RBC/hpf   Bacteria, UA RARE RARE   Urine-Other MUCOUS PRESENT    3:05 AM Patient able to eat and drink without emesis.    Paula Libra, MD 09/28/14 [phone]

## 2014-09-28 NOTE — ED Notes (Signed)
Pt had not drunk his Sprite stating that the doctor had not brought him the crackers he said he'd bring.  MD stated that he did not say that but said it was OK to give pt crackers.  Saltine crackers given.

## 2014-09-28 NOTE — ED Notes (Signed)
Patient states that he threw up on Friday. He now states that he has been unable to eat any food but able to eat ensure. Is drinking ensure in triage. The patient reports that he is having low back pain, and just weak all over.

## 2017-06-29 ENCOUNTER — Other Ambulatory Visit: Payer: Self-pay

## 2017-06-29 ENCOUNTER — Encounter (HOSPITAL_BASED_OUTPATIENT_CLINIC_OR_DEPARTMENT_OTHER): Payer: Self-pay | Admitting: Emergency Medicine

## 2017-06-29 ENCOUNTER — Emergency Department (HOSPITAL_BASED_OUTPATIENT_CLINIC_OR_DEPARTMENT_OTHER): Payer: No Typology Code available for payment source

## 2017-06-29 ENCOUNTER — Emergency Department (HOSPITAL_BASED_OUTPATIENT_CLINIC_OR_DEPARTMENT_OTHER)
Admission: EM | Admit: 2017-06-29 | Discharge: 2017-06-29 | Disposition: A | Discharge status: Home / Self Care | Payer: No Typology Code available for payment source | Attending: Emergency Medicine | Admitting: Emergency Medicine

## 2017-06-29 DIAGNOSIS — Y999 Unspecified external cause status: Secondary | ICD-10-CM | POA: Insufficient documentation

## 2017-06-29 DIAGNOSIS — Y92410 Unspecified street and highway as the place of occurrence of the external cause: Secondary | ICD-10-CM | POA: Diagnosis not present

## 2017-06-29 DIAGNOSIS — M7918 Myalgia, other site: Secondary | ICD-10-CM | POA: Diagnosis present

## 2017-06-29 DIAGNOSIS — F17210 Nicotine dependence, cigarettes, uncomplicated: Secondary | ICD-10-CM | POA: Insufficient documentation

## 2017-06-29 DIAGNOSIS — Y9389 Activity, other specified: Secondary | ICD-10-CM | POA: Diagnosis not present

## 2017-06-29 MED ORDER — NAPROXEN 500 MG PO TABS: 500.0000 mg | ORAL_TABLET | Freq: Two times a day (BID) | ORAL | 0 refills | Status: DC

## 2017-06-29 MED ORDER — CYCLOBENZAPRINE HCL 10 MG PO TABS: 10.0000 mg | ORAL_TABLET | Freq: Two times a day (BID) | ORAL | 0 refills | Status: AC | PRN
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
## 2017-10-06 ENCOUNTER — Other Ambulatory Visit: Payer: Self-pay

## 2017-10-06 ENCOUNTER — Encounter (HOSPITAL_BASED_OUTPATIENT_CLINIC_OR_DEPARTMENT_OTHER): Payer: Self-pay | Admitting: Emergency Medicine

## 2017-10-06 ENCOUNTER — Emergency Department (HOSPITAL_BASED_OUTPATIENT_CLINIC_OR_DEPARTMENT_OTHER)
Admission: EM | Admit: 2017-10-06 | Discharge: 2017-10-06 | Disposition: A | Discharge status: Home / Self Care | Payer: Self-pay | Attending: Emergency Medicine | Admitting: Emergency Medicine

## 2017-10-06 ENCOUNTER — Emergency Department (HOSPITAL_BASED_OUTPATIENT_CLINIC_OR_DEPARTMENT_OTHER): Payer: Self-pay

## 2017-10-06 DIAGNOSIS — F17210 Nicotine dependence, cigarettes, uncomplicated: Secondary | ICD-10-CM | POA: Insufficient documentation

## 2017-10-06 DIAGNOSIS — R0602 Shortness of breath: Secondary | ICD-10-CM | POA: Insufficient documentation

## 2017-10-06 HISTORY — DX: Other psychoactive substance abuse, uncomplicated: F19.10

## 2017-10-06 LAB — COMPREHENSIVE METABOLIC PANEL
ALBUMIN: 4.5 g/dL (ref 3.5–5.0)
ALK PHOS: 50 U/L (ref 38–126)
ALT: 13 U/L (ref 0–44)
ANION GAP: 9 (ref 5–15)
AST: 13 U/L — ABNORMAL LOW (ref 15–41)
BUN: 6 mg/dL (ref 6–20)
CALCIUM: 9.4 mg/dL (ref 8.9–10.3)
CO2: 29 mmol/L (ref 22–32)
Chloride: 102 mmol/L (ref 98–111)
Creatinine, Ser: 0.97 mg/dL (ref 0.61–1.24)
GFR calc non Af Amer: >60 mL/min (ref >60)
GLUCOSE: 102 mg/dL — AB (ref 70–99)
POTASSIUM: 3.7 mmol/L (ref 3.5–5.1)
Sodium: 140 mmol/L (ref 135–145)
Total Bilirubin: 1 mg/dL (ref 0.3–1.2)
Total Protein: 7.8 g/dL (ref 6.5–8.1)

## 2017-10-06 LAB — CBC WITH DIFFERENTIAL/PLATELET
BASOS PCT: 1 %
Basophils Absolute: 0.1 10*3/uL (ref 0.0–0.1)
EOS ABS: 0.3 10*3/uL (ref 0.0–0.7)
EOS PCT: 4 %
HCT: 48.5 % (ref 39.0–52.0)
Hemoglobin: 17.7 g/dL — ABNORMAL HIGH (ref 13.0–17.0)
Lymphocytes Relative: 31 %
Lymphs Abs: 2.2 10*3/uL (ref 0.7–4.0)
MCH: 32.5 pg (ref 26.0–34.0)
MCHC: 36.5 g/dL — AB (ref 30.0–36.0)
MCV: 89.2 fL (ref 78.0–100.0)
MONOS PCT: 11 %
Monocytes Absolute: 0.8 10*3/uL (ref 0.1–1.0)
Neutro Abs: 3.8 10*3/uL (ref 1.7–7.7)
Neutrophils Relative %: 53 %
PLATELETS: 336 10*3/uL (ref 150–400)
RBC: 5.44 MIL/uL (ref 4.22–5.81)
RDW: 12.2 % (ref 11.5–15.5)
WBC: 7.1 10*3/uL (ref 4.0–10.5)

## 2017-10-06 LAB — TROPONIN I

## 2017-10-06 LAB — BRAIN NATRIURETIC PEPTIDE: B Natriuretic Peptide: 8.8 pg/mL (ref 0.0–100.0)

## 2017-10-06 LAB — D-DIMER, QUANTITATIVE (NOT AT ARMC)

## 2017-10-06 NOTE — Discharge Instructions (Addendum)
If you develop worsening or new concerning symptoms you can return to the emergency department for re-evaluation.  ° °

## 2017-10-06 NOTE — ED Notes (Signed)
Pt understood dc material. NAD Noted 

## 2017-10-06 NOTE — ED Notes (Signed)
When RN entered room pt was asleep with his arms above his head. Pt did not want to open eyes to talk to RN

## 2017-10-06 NOTE — ED Triage Notes (Signed)
States," When I don't use meth , I get SOB and feel funny" Last dose yesterday. Uses several times a week . Texting on phone , calm, while speaking to nurse

## 2017-10-06 NOTE — ED Provider Notes (Signed)
[facility] EMERGENCY DEPARTMENT Provider Note   CSN: [id number] Arrival date & time: 10/06/17  1144     History   Chief Complaint Chief Complaint  Patient presents with  . Drug Problem  . Shortness of Breath    HPI Jeff Yang is a 32 y.o. male with a history of drug abuse and tobacco abuse who presents emergency department today for shortness of breath.  Patient reports that this morning when he awoke he had shortness of breath at rest that was not worsened with exertion or when lying down.  He reports he typically gets these symptoms whenever he does not use meth.  He states that he typically uses meth approximately 3-4 times per week.  He notes his last dose was yesterday.  He states he rarely ingests meth and does not use any IV drug use.  He states he also has some dizziness this morning that he describes as feeling of unsteadiness.  He denies any vertigo. This has since resolved. No headache, visual changes, fever, neck stiffness, facial droop, difficulty with speech, focal weakness or difficulty with gait.  Patient states that nothing makes his symptoms better or worse.  He denies any associated fever, chills, cough, hemoptysis, chest pain, shortness of breath is worse with deep inspiration, lower extremity swelling or rash.  Patient is a very poor historian.  He notes that he smokes approximately 3 black and milds per day.  No history of COPD, CHF or asthma.    HPI  Past Medical History:  Diagnosis Date  . Drug abuse (HCC)   . Shoulder dislocation     There are no active problems to display for this patient.   History reviewed. No pertinent surgical history.      Home Medications    Prior to Admission medications   Medication Sig Start Date End Date Taking? Authorizing Provider  cyclobenzaprine (FLEXERIL) 10 MG tablet Take 1 tablet (10 mg total) by mouth 2 (two) times daily as needed for muscle spasms. 06/29/17   Khatri, Hina, PA-C  naproxen (NAPROSYN) 500  MG tablet Take 1 tablet (500 mg total) by mouth 2 (two) times daily. 06/29/17   Khatri, Hina, PA-C  ondansetron (ZOFRAN ODT) 8 MG disintegrating tablet Take 1 tablet (8 mg total) by mouth every 8 (eight) hours as needed for nausea or vomiting. 09/28/14   Molpus, John, MD    Family History No family history on file.  Social History Social History   Tobacco Use  . Smoking status: Current Some Day Smoker  . Smokeless tobacco: Never Used  Substance Use Topics  . Alcohol use: Yes    Comment: occ  . Drug use: Yes    Types: Methamphetamines     Allergies   Patient has no known allergies.   Review of Systems Review of Systems  All other systems reviewed and are negative.    Physical Exam Updated Vital Signs BP 138/89 (BP Location: Left Arm)   Pulse 87   Temp 98.6 F (37 C) (Oral)   Resp 16   Wt 85.3 kg   SpO2 98%   BMI 25.50 kg/m   Physical Exam  Constitutional: He appears well-developed and well-nourished.  HENT:  Head: Normocephalic and atraumatic.  Right Ear: External ear normal.  Left Ear: External ear normal.  Nose: Nose normal.  Mouth/Throat: Uvula is midline, oropharynx is clear and moist and mucous membranes are normal. No tonsillar exudate.  Eyes: Pupils are equal, round, and reactive to light. Right  eye exhibits no discharge. Left eye exhibits no discharge. No scleral icterus.  Neck: Trachea normal. Neck supple. No spinous process tenderness present. No neck rigidity. Normal range of motion present.  No nuchal rigidity or meningismus. No JVD.   Cardiovascular: Normal rate, regular rhythm and intact distal pulses.  No murmur heard. Pulses:      Radial pulses are 2+ on the right side, and 2+ on the left side.       Dorsalis pedis pulses are 2+ on the right side, and 2+ on the left side.       Posterior tibial pulses are 2+ on the right side, and 2+ on the left side.  No lower extremity swelling or edema. Calves symmetric in size bilaterally.  Pulmonary/Chest:  Effort normal and breath sounds normal. He exhibits no tenderness.  Patient sating at 98% on room air. No increased work of breathing. No accessory muscle use. Patient is sitting upright, speaking in full sentences without difficulty. Lungs clear to ausculation bilaterally.   Abdominal: Soft. Bowel sounds are normal. There is no tenderness. There is no rebound and no guarding.  Musculoskeletal: He exhibits no edema.  Lymphadenopathy:    He has no cervical adenopathy.  Neurological: He is alert.  Speech clear. Follows commands. No facial droop. PERRLA. EOMI. Normal peripheral fields. CN III-XII intact.  Grossly moves all extremities 4 without ataxia. Coordination intact. Able and appropriate strength for age to upper and lower extremities bilaterally including grip strength & plantar flexion/dorsiflexion. Sensation to light touch intact bilaterally for upper and lower. Patellar deep tendon reflex 2+ and equal bilaterally. Normal finger to nose. No pronator drift.   Skin: Skin is warm and dry. No rash noted. He is not diaphoretic.  Psychiatric: He has a normal mood and affect.  Nursing note and vitals reviewed.    ED Treatments / Results  Labs (all labs ordered are listed, but only abnormal results are displayed) Labs Reviewed  COMPREHENSIVE METABOLIC PANEL - Abnormal; Notable for the following components:      Result Value   Glucose, Bld 102 (*)    AST 13 (*)    All other components within normal limits  CBC WITH DIFFERENTIAL/PLATELET - Abnormal; Notable for the following components:   Hemoglobin 17.7 (*)    MCHC 36.5 (*)    All other components within normal limits  D-DIMER, QUANTITATIVE (NOT AT [facility])  TROPONIN I  BRAIN NATRIURETIC PEPTIDE    EKG EKG Interpretation  Date/Time:  Sunday October 06 2017 12:40:58 EDT Ventricular Rate:  79 PR Interval:    QRS Duration: 85 QT Interval:  364 QTC Calculation: 418 R Axis:   31 Text Interpretation:  Sinus rhythm Confirmed by  Vanetta MuldersZackowski, Scott [phone]([id number]) on 10/06/2017 12:46:29 PM   Radiology Dg Chest 2 View  Result Date: 10/06/2017 CLINICAL DATA:  Shortness of breath EXAM: CHEST - 2 VIEW COMPARISON:  06/29/2017 FINDINGS: The heart size and mediastinal contours are within normal limits. Both lungs are clear. The visualized skeletal structures are unremarkable. IMPRESSION: No active cardiopulmonary disease. Electronically Signed   By: Alcide CleverMark  Lukens M.D.   On: 10/06/2017 13:04    Procedures Procedures (including critical care time)  Medications Ordered in ED Medications - No data to display   Initial Impression / Assessment and Plan / ED Course  I have reviewed the triage vital signs and the nursing notes.  Pertinent labs & imaging results that were available during my care of the patient were reviewed by me and considered  in my medical decision making (see chart for details).     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  Patient denies any associated fever, cough, lower extremity swelling or hemoptysis.  He denies any infectious symptoms.  Of mild tachycardia of 106 on presentation.  On repeat testing this improved without intervention.  Patient without history of COPD, CHF or asthma.  He does report tobacco abuse.  Denies any IV drug use.  On exam patient's lungs are clear to auscultation bilaterally.  There is no JVD.  No lower extremity swelling.  No murmur.  Patient is resting comfortably no acute distress.  Work-up is overall reassuring.  Patient without leukocytosis.  CBC and CMP otherwise unremarkable.  D-dimer within normal limits.  Do not feel he needs further work-up for PE.  EKG NSR.  Troponin within normal limits.  BNP within normal limits.  CXR unremarkable.  No evidence of acute infiltrate, pulmonary edema, cardiomegaly, or pneumothorax.  Patient without hypoxia in the department.   The evaluation does not show pathology that would require ongoing emergent  intervention or inpatient treatment. I advised the patient to follow-up with PCP this week. I advised the patient to return to the emergency department with new or worsening symptoms or new concerns. Specific return precautions discussed. The patient verbalized understanding and agreement with plan. All questions answered. No further questions at this time. The patient is hemodynamically stable, mentating appropriately and appears safe for discharge.  Final Clinical Impressions(s) / ED Diagnoses   Final diagnoses:  SOB (shortness of breath)    ED Discharge Orders    None       Princella Pellegrini 10/06/17 Tonita Phoenix, MD 10/09/17 [phone]

## 2018-03-14 ENCOUNTER — Other Ambulatory Visit: Payer: Self-pay

## 2018-03-14 ENCOUNTER — Emergency Department (HOSPITAL_BASED_OUTPATIENT_CLINIC_OR_DEPARTMENT_OTHER)
Admission: EM | Admit: 2018-03-14 | Discharge: 2018-03-14 | Disposition: A | Discharge status: Home / Self Care | Payer: Self-pay | Attending: Emergency Medicine | Admitting: Emergency Medicine

## 2018-03-14 ENCOUNTER — Encounter (HOSPITAL_BASED_OUTPATIENT_CLINIC_OR_DEPARTMENT_OTHER): Payer: Self-pay | Admitting: Emergency Medicine

## 2018-03-14 DIAGNOSIS — R1084 Generalized abdominal pain: Secondary | ICD-10-CM | POA: Insufficient documentation

## 2018-03-14 DIAGNOSIS — F17290 Nicotine dependence, other tobacco product, uncomplicated: Secondary | ICD-10-CM | POA: Insufficient documentation

## 2018-03-14 LAB — URINALYSIS, MICROSCOPIC (REFLEX)

## 2018-03-14 LAB — URINALYSIS, ROUTINE W REFLEX MICROSCOPIC
Bilirubin Urine: NEGATIVE
Glucose, UA: NEGATIVE mg/dL
Ketones, ur: NEGATIVE mg/dL
Leukocytes,Ua: NEGATIVE
Nitrite: NEGATIVE
PH: 6 (ref 5.0–8.0)
Protein, ur: NEGATIVE mg/dL
Specific Gravity, Urine: 1.01 (ref 1.005–1.030)

## 2018-03-14 LAB — CBC
HEMATOCRIT: 47 % (ref 39.0–52.0)
HEMOGLOBIN: 15.6 g/dL (ref 13.0–17.0)
MCH: 31.3 pg (ref 26.0–34.0)
MCHC: 33.2 g/dL (ref 30.0–36.0)
MCV: 94.2 fL (ref 80.0–100.0)
Platelets: 308 10*3/uL (ref 150–400)
RBC: 4.99 MIL/uL (ref 4.22–5.81)
RDW: 11.9 % (ref 11.5–15.5)
WBC: 5.6 10*3/uL (ref 4.0–10.5)
nRBC: 0 % (ref 0.0–0.2)

## 2018-03-14 LAB — COMPREHENSIVE METABOLIC PANEL
ALBUMIN: 4.2 g/dL (ref 3.5–5.0)
ALT: 14 U/L (ref 0–44)
ANION GAP: 6 (ref 5–15)
AST: 15 U/L (ref 15–41)
Alkaline Phosphatase: 41 U/L (ref 38–126)
BILIRUBIN TOTAL: 0.7 mg/dL (ref 0.3–1.2)
BUN: 8 mg/dL (ref 6–20)
CO2: 28 mmol/L (ref 22–32)
Calcium: 9 mg/dL (ref 8.9–10.3)
Chloride: 102 mmol/L (ref 98–111)
Creatinine, Ser: 1 mg/dL (ref 0.61–1.24)
GFR calc Af Amer: >60 mL/min (ref >60)
GFR calc non Af Amer: >60 mL/min (ref >60)
GLUCOSE: 106 mg/dL — AB (ref 70–99)
POTASSIUM: 3.5 mmol/L (ref 3.5–5.1)
Sodium: 136 mmol/L (ref 135–145)
TOTAL PROTEIN: 7.3 g/dL (ref 6.5–8.1)

## 2018-03-14 LAB — LIPASE, BLOOD: LIPASE: 32 U/L (ref 11–51)

## 2018-03-14 MED ORDER — PANTOPRAZOLE SODIUM 40 MG PO TBEC: 40.0000 mg | DELAYED_RELEASE_TABLET | Freq: Every day | ORAL | 0 refills | Status: AC

## 2018-03-14 MED ORDER — SODIUM CHLORIDE 0.9 % IV BOLUS
1000.0000 mL | Freq: Once | INTRAVENOUS | Status: AC
  Administered 2018-03-14: 1000 mL via INTRAVENOUS

## 2018-03-14 MED ORDER — DICYCLOMINE HCL 10 MG PO CAPS
10.0000 mg | ORAL_CAPSULE | Freq: Once | ORAL | Status: AC
  Administered 2018-03-14: 10 mg via ORAL
  Filled 2018-03-14: qty 1

## 2018-03-14 MED ORDER — ALUM & MAG HYDROXIDE-SIMETH 200-200-20 MG/5ML PO SUSP
30.0000 mL | Freq: Once | ORAL | Status: AC
  Administered 2018-03-14: 30 mL via ORAL
  Filled 2018-03-14: qty 30

## 2018-03-14 MED ORDER — SODIUM CHLORIDE 0.9% FLUSH
3.0000 mL | Freq: Once | INTRAVENOUS | Status: DC
  Filled 2018-03-14: qty 3

## 2018-03-14 NOTE — Discharge Instructions (Signed)

## 2018-03-14 NOTE — ED Provider Notes (Signed)
Emergency Department Provider Note   I have reviewed the triage vital signs and the nursing notes.   HISTORY  Chief Complaint Abdominal Pain   HPI Jeff Yang is a 33 y.o. male patient presents to the emergency department with intermittent abdominal pain which is occurred over the past year.  Patient describes epigastric and periumbilical abdominal discomfort.  No clear precipitating or modifying factors.  Patient denies significant pain with eating.  No vomiting or diarrhea.  He drinks alcohol and smokes marijuana occasionally but nothing recently.  Denies any chest pain, shortness of breath, cough symptoms.  No sick contacts.  He reports symptoms which lasted for several days and then may be absent for the next 1 to 2 weeks but then returned.  The current episode, which brings him to the ED, has been ongoing for the past 5 days.  Past Medical History:  Diagnosis Date  . Drug abuse (HCC)   . Shoulder dislocation     There are no active problems to display for this patient.   History reviewed. No pertinent surgical history.  Allergies Tramadol  History reviewed. No pertinent family history.  Social History Social History   Tobacco Use  . Smoking status: Current Some Day Smoker    Types: Cigars  . Smokeless tobacco: Never Used  Substance Use Topics  . Alcohol use: Yes    Comment: occ  . Drug use: Yes    Types: Methamphetamines    Review of Systems  Constitutional: No fever/chills Eyes: No visual changes. ENT: No sore throat. Cardiovascular: Denies chest pain. Respiratory: Denies shortness of breath. Gastrointestinal: Positive abdominal pain.  No nausea, no vomiting.  No diarrhea.  No constipation. Genitourinary: Negative for dysuria. Musculoskeletal: Negative for back pain. Skin: Negative for rash. Neurological: Negative for headaches, focal weakness or numbness.  10-point ROS otherwise  negative.  ____________________________________________   PHYSICAL EXAM:  VITAL SIGNS: ED Triage Vitals [03/14/18 1125]  Enc Vitals Group     BP (!) 134/98     Pulse Rate 83     Resp 16     Temp 98.3 F (36.8 C)     Temp Source Oral     SpO2 100 %     Weight 185 lb (83.9 kg)     Height 5\' 11"  (1.803 m)   Constitutional: Alert and oriented. Well appearing and in no acute distress. Eyes: Conjunctivae are normal.  Head: Atraumatic. Nose: No congestion/rhinnorhea. Mouth/Throat: Mucous membranes are moist.  Neck: No stridor.  Cardiovascular: Normal rate, regular rhythm. Good peripheral circulation. Grossly normal heart sounds.   Respiratory: Normal respiratory effort.  No retractions. Lungs CTAB. Gastrointestinal: Soft with mild epigastric tenderness. No distention.  Musculoskeletal: No lower extremity tenderness nor edema. No gross deformities of extremities. Neurologic:  Normal speech and language. No gross focal neurologic deficits are appreciated.  Skin:  Skin is warm, dry and intact. No rash noted.   ____________________________________________   LABS (all labs ordered are listed, but only abnormal results are displayed)  Labs Reviewed  COMPREHENSIVE METABOLIC PANEL - Abnormal; Notable for the following components:      Result Value   Glucose, Bld 106 (*)    All other components within normal limits  URINALYSIS, ROUTINE W REFLEX MICROSCOPIC - Abnormal; Notable for the following components:   Hgb urine dipstick TRACE (*)    All other components within normal limits  URINALYSIS, MICROSCOPIC (REFLEX) - Abnormal; Notable for the following components:   Bacteria, UA RARE (*)  All other components within normal limits  LIPASE, BLOOD  CBC   ____________________________________________  RADIOLOGY  None ____________________________________________   PROCEDURES  Procedure(s) performed:    Procedures  None ____________________________________________   INITIAL IMPRESSION / ASSESSMENT AND PLAN / ED COURSE  Pertinent labs & imaging results that were available during my care of the patient were reviewed by me and considered in my medical decision making (see chart for details).  Patient presents to the emergency department with intermittent, epigastric abdominal pain over the past year.  Exam shows no significant tenderness to palpation throughout all abdominal quadrants.  Patient is well-appearing, well-hydrated, without fever, without abnormal vital signs.  No unintentional weight loss or other findings to suspect malignancy.  Symptoms seem most consistent with gastritis.  Plan to treat empirically and have the patient follow with a PCP and noted that he may require referral to gastroenterology for upper endoscopy should symptoms not improve with conservative management.  Patient verbalizes understanding along with follow-up plan.   ____________________________________________  FINAL CLINICAL IMPRESSION(S) / ED DIAGNOSES  Final diagnoses:  Generalized abdominal pain     MEDICATIONS GIVEN DURING THIS VISIT:  Medications  sodium chloride 0.9 % bolus 1,000 mL (0 mLs Intravenous Stopped 03/14/18 1436)  dicyclomine (BENTYL) capsule 10 mg (10 mg Oral Given 03/14/18 1317)  alum & mag hydroxide-simeth (MAALOX/MYLANTA) 200-200-20 MG/5ML suspension 30 mL (30 mLs Oral Given 03/14/18 1318)     NEW OUTPATIENT MEDICATIONS STARTED DURING THIS VISIT:  Discharge Medication List as of 03/14/2018  2:27 PM    START taking these medications   Details  pantoprazole (PROTONIX) 40 MG tablet Take 1 tablet (40 mg total) by mouth daily for 30 days., Starting Fri 03/14/2018, Until Sun 04/13/2018, Print        Note:  This document was prepared using Dragon voice recognition software and may include unintentional dictation errors.  Alona Bene, MD Emergency Medicine    Devynn Scheff, Arlyss Repress,  MD 03/14/18 2006

## 2018-03-14 NOTE — ED Triage Notes (Signed)
Reports intermittent generalized abdominal pain for year.  Reports nausea.  Denies vomiting, diarrhea.

## 2018-03-14 NOTE — ED Notes (Addendum)
MD at bedside discussing results with patient and family. 

## 2018-07-23 ENCOUNTER — Emergency Department (HOSPITAL_COMMUNITY)
Admission: EM | Admit: 2018-07-23 | Discharge: 2018-07-23 | Disposition: A | Discharge status: Home / Self Care | Payer: Self-pay | Attending: Emergency Medicine | Admitting: Emergency Medicine

## 2018-07-23 ENCOUNTER — Other Ambulatory Visit: Payer: Self-pay

## 2018-07-23 NOTE — ED Provider Notes (Signed)
Patient eloped from the emergency department before provider evaluation. I did not see or evaluate this patient. Patient eloped prior to triage note or nurse evaluation. Patient did not notify anyone prior to leaving.    Darlin Drop [place], [place] 07/23/18 1734    Sherwood Gambler, MD 07/24/18 1651
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
## 2019-07-10 IMAGING — CR DG CHEST 2V
2 series · 2 of 2 positions shown · non-contrast
Comparison: 06/29/2017

CLINICAL DATA: Shortness of breath

EXAM:
CHEST - 2 VIEW

[w chest pa]
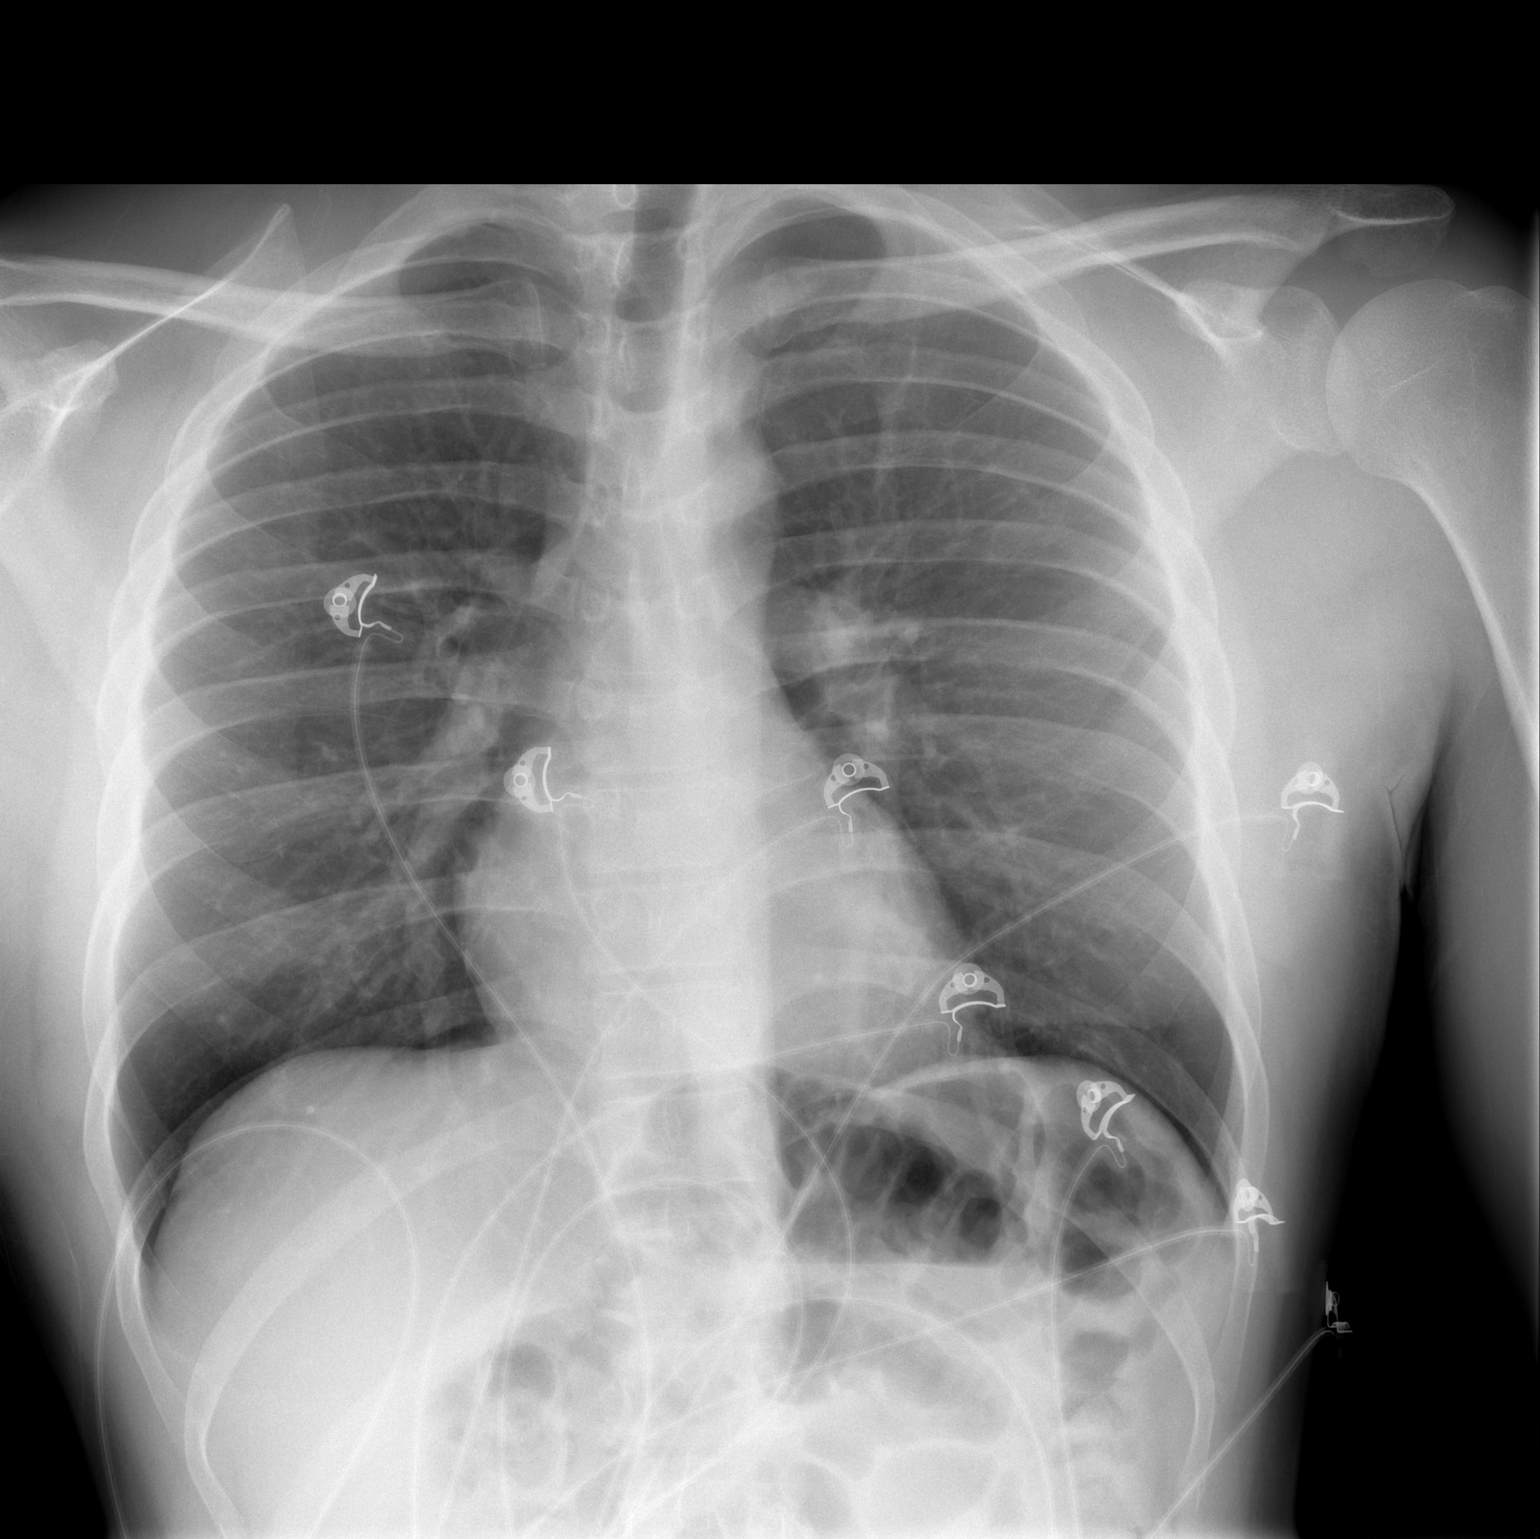

[w chest lat]
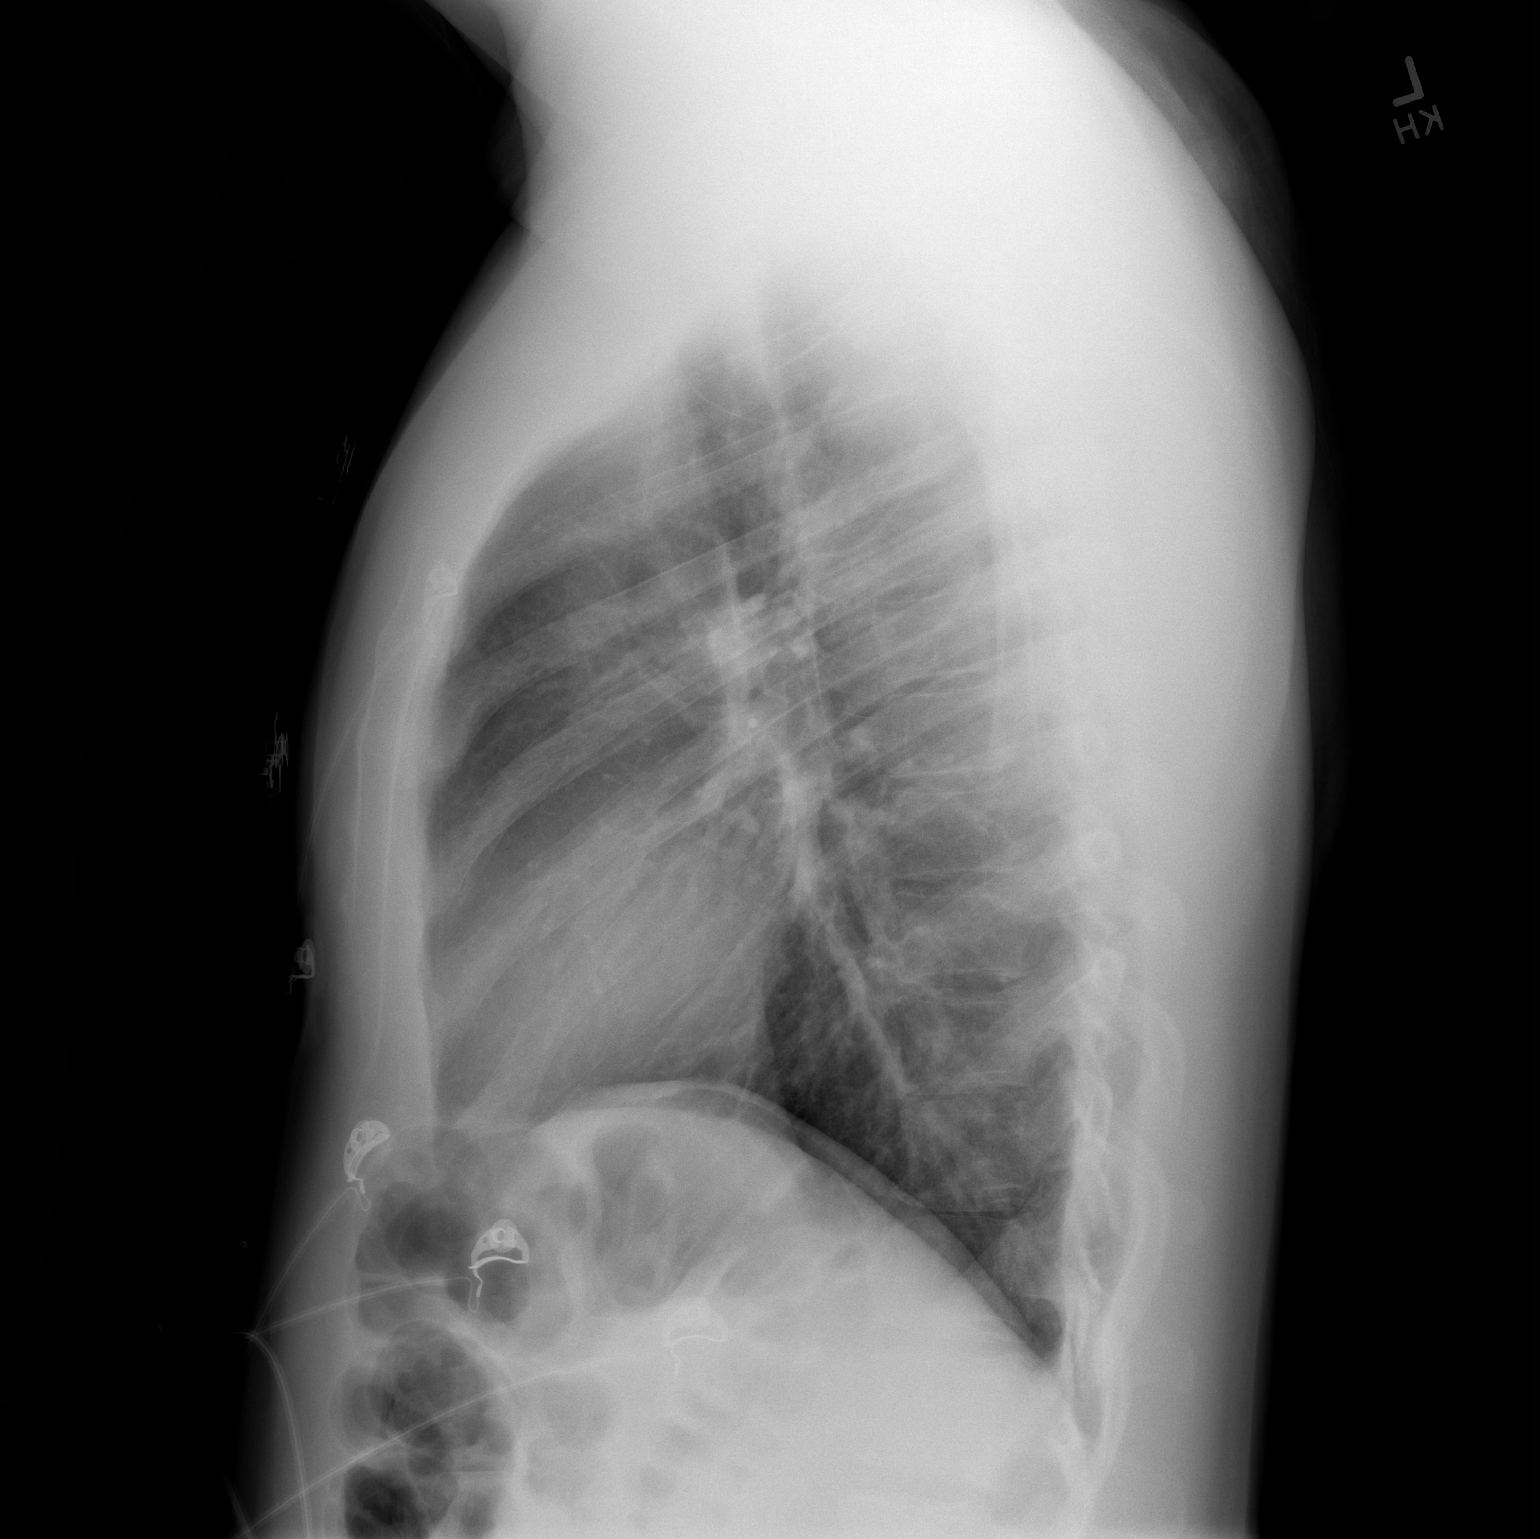

[2 of 2 positions shown; findings below may reference images not displayed]

FINDINGS: The heart size and mediastinal contours are within normal limits.
Both lungs are clear. The visualized skeletal structures are
unremarkable.
IMPRESSION: No active cardiopulmonary disease.

## 2022-11-22 ENCOUNTER — Emergency Department (HOSPITAL_COMMUNITY)
Admission: EM | Admit: 2022-11-22 | Discharge: 2022-11-22 | Disposition: A | Discharge status: Home / Self Care | Payer: Medicaid Other | Attending: Emergency Medicine | Admitting: Emergency Medicine

## 2022-11-22 ENCOUNTER — Encounter (HOSPITAL_COMMUNITY): Payer: Self-pay | Admitting: Emergency Medicine

## 2022-11-22 ENCOUNTER — Other Ambulatory Visit: Payer: Self-pay

## 2022-11-22 ENCOUNTER — Other Ambulatory Visit (HOSPITAL_BASED_OUTPATIENT_CLINIC_OR_DEPARTMENT_OTHER): Payer: Self-pay

## 2022-11-22 DIAGNOSIS — G8929 Other chronic pain: Secondary | ICD-10-CM | POA: Diagnosis not present

## 2022-11-22 DIAGNOSIS — M545 Low back pain, unspecified: Secondary | ICD-10-CM | POA: Insufficient documentation

## 2022-11-22 MED ORDER — KETOROLAC TROMETHAMINE 60 MG/2ML IM SOLN
60.0000 mg | Freq: Once | INTRAMUSCULAR | Status: AC
  Administered 2022-11-22: 60 mg via INTRAMUSCULAR
  Filled 2022-11-22: qty 2

## 2022-11-22 MED ORDER — MELOXICAM 7.5 MG PO TABS
7.5000 mg | ORAL_TABLET | Freq: Every day | ORAL | 0 refills | Status: AC
  Filled 2022-11-22: qty 5, 5d supply, fill #0

## 2022-11-22 NOTE — ED Notes (Signed)
Pt refusing discharge vitals.

## 2022-11-22 NOTE — ED Notes (Signed)
Pt cleared for lobby by PA Peyton Najjar.

## 2022-11-22 NOTE — ED Notes (Signed)
Pt refusing to acknowledge nurse during discharge instructions.  Patient also refusing to wake up and be discharged from stretcher.

## 2022-11-22 NOTE — ED Provider Notes (Signed)
[place] EMERGENCY DEPARTMENT AT [facility] Provider Note   CSN: [id number] Arrival date & time: 11/22/22  0219     History  Chief Complaint  Patient presents with   Back Pain    Jeff Yang is a 37 y.o. male.  The history is provided by the patient.  Back Pain Location:  Sacro-iliac joint Quality:  Aching Pain severity:  Moderate Pain is:  Same all the time Timing:  Constant Progression:  Unchanged Chronicity:  Chronic Context: not physical stress and not recent illness   Associated symptoms: no chest pain, no fever and no weakness        Home Medications Prior to Admission medications   Medication Sig Start Date End Date Taking? Authorizing Provider  cyclobenzaprine (FLEXERIL) 10 MG tablet Take 1 tablet (10 mg total) by mouth 2 (two) times daily as needed for muscle spasms. 06/29/17   Khatri, Hina, PA-C  naproxen (NAPROSYN) 500 MG tablet Take 1 tablet (500 mg total) by mouth 2 (two) times daily. 06/29/17   Khatri, Hina, PA-C  ondansetron (ZOFRAN ODT) 8 MG disintegrating tablet Take 1 tablet (8 mg total) by mouth every 8 (eight) hours as needed for nausea or vomiting. 09/28/14   Molpus, John, MD  pantoprazole (PROTONIX) 40 MG tablet Take 1 tablet (40 mg total) by mouth daily for 30 days. 03/14/18 04/13/18  Long, Arlyss Repress, MD      Allergies    Tramadol    Review of Systems   Review of Systems  Constitutional:  Negative for fever.  HENT:  Negative for facial swelling.   Respiratory:  Negative for wheezing and stridor.   Cardiovascular:  Negative for chest pain.  Genitourinary:  Negative for difficulty urinating.  Musculoskeletal:  Positive for back pain.  Neurological:  Negative for weakness.  All other systems reviewed and are negative.   Physical Exam Updated Vital Signs BP (!) 131/90 (BP Location: Right Arm)   Pulse 90   Resp 18   SpO2 99%  Physical Exam Vitals and nursing note reviewed.  Constitutional:      General: He is not in acute  distress.    Appearance: Normal appearance. He is well-developed. He is not diaphoretic.  HENT:     Head: Normocephalic and atraumatic.     Nose: Nose normal.  Eyes:     Conjunctiva/sclera: Conjunctivae normal.     Pupils: Pupils are equal, round, and reactive to light.  Cardiovascular:     Rate and Rhythm: Normal rate and regular rhythm.     Pulses: Normal pulses.     Heart sounds: Normal heart sounds.  Pulmonary:     Effort: Pulmonary effort is normal.     Breath sounds: Normal breath sounds. No wheezing or rales.  Abdominal:     General: Bowel sounds are normal.     Palpations: Abdomen is soft.     Tenderness: There is no abdominal tenderness. There is no guarding or rebound.  Musculoskeletal:     Cervical back: Normal, normal range of motion and neck supple.     Thoracic back: Normal.     Lumbar back: No spasms or bony tenderness. Normal range of motion.  Skin:    General: Skin is warm and dry.     Capillary Refill: Capillary refill takes less than 2 seconds.  Neurological:     Mental Status: He is alert and oriented to person, place, and time.     Deep Tendon Reflexes: Reflexes normal.  Psychiatric:  Mood and Affect: Mood normal.        Behavior: Behavior normal.     ED Results / Procedures / Treatments   Labs (all labs ordered are listed, but only abnormal results are displayed) Labs Reviewed - No data to display  EKG None  Radiology No results found.  Procedures Procedures    Medications Ordered in ED Medications  ketorolac (TORADOL) injection 60 mg (has no administration in time range)    ED Course/ Medical Decision Making/ A&P                                 Medical Decision Making Patient with ongoing back pain   Amount and/or Complexity of Data Reviewed External Data Reviewed: notes.    Details: Previous notes reviewed   Risk Prescription drug management. Risk Details: Spine is non tender, no red flags no trauma.  Pain is chronic.   Stable for discharge.      Final Clinical Impression(s) / ED Diagnoses Final diagnoses:  Chronic low back pain, unspecified back pain laterality, unspecified whether sciatica present    Return for intractable cough, coughing up blood, fevers > 100.4 unrelieved by medication, shortness of breath, intractable vomiting, chest pain, shortness of breath, weakness, numbness, changes in speech, facial asymmetry, abdominal pain, passing out, Inability to tolerate liquids or food, cough, altered mental status or any concerns. No signs of systemic illness or infection. The patient is nontoxic-appearing on exam and vital signs are within normal limits.  I have reviewed the triage vital signs and the nursing notes. Pertinent labs & imaging results that were available during my care of the patient were reviewed by me and considered in my medical decision making (see chart for details). After history, exam, and medical workup I feel the patient has been appropriately medically screened and is safe for discharge home. Pertinent diagnoses were discussed with the patient. Patient was given return precautions Rx / DC Orders ED Discharge Orders     None         Oshea Percival, MD 11/22/22 [phone]

## 2022-11-22 NOTE — ED Provider Triage Note (Signed)
Emergency Medicine Provider Triage Evaluation Note  Jeff Yang , a 37 y.o. male  was evaluated in triage.  Pt complains of right-sided back pain.  Patient refused to talk to nursing staff and was reluctant to answer any questions when asked about his history.  Patient states he has tried tramadol for the back pain even though his allergy list shows that he is allergic to tramadol.  Brief chart review shows visit outside emergency department for same complaint earlier in the month and for other varying complaints over the past 2 months at outside emergency departments or patient was also reluctant answer questions, eloped, had history of noncompliance.  Review of Systems  Positive: Back pain Negative:   Physical Exam  BP (!) 131/90 (BP Location: Right Arm)   Pulse 90   Resp 18   SpO2 99%  Gen:   Awake, no distress   Resp:  Normal effort  MSK:   Moves extremities without difficulty  Other:  No TPP to palpation midline spine, no lumbar tenderness  Medical Decision Making  Medically screening exam initiated at 2:41 AM.  Appropriate orders placed.  Jeff Yang was informed that the remainder of the evaluation will be completed by another provider, this initial triage assessment does not replace that evaluation, and the importance of remaining in the ED until their evaluation is complete.     Jeff Grinder, PA-C 11/22/22 [phone]

## 2022-11-22 NOTE — ED Triage Notes (Addendum)
Per EMS, pt c/o lower back pain but is unable to give any details concerning the pain.   146pal 94pulse  18RR 99% RA  Pt refused to speak to RN other than to say "I told the other people what was wrong."  Provider aware.

## 2022-12-04 ENCOUNTER — Other Ambulatory Visit (HOSPITAL_BASED_OUTPATIENT_CLINIC_OR_DEPARTMENT_OTHER): Payer: Self-pay

## 2023-02-05 ENCOUNTER — Ambulatory Visit (HOSPITAL_COMMUNITY)
Admission: EM | Admit: 2023-02-05 | Discharge: 2023-02-06 | Disposition: A | Discharge status: Home / Self Care | Payer: MEDICAID | Attending: Psychiatry | Admitting: Psychiatry

## 2023-02-05 DIAGNOSIS — M549 Dorsalgia, unspecified: Secondary | ICD-10-CM | POA: Insufficient documentation

## 2023-02-05 DIAGNOSIS — F419 Anxiety disorder, unspecified: Secondary | ICD-10-CM | POA: Insufficient documentation

## 2023-02-05 DIAGNOSIS — Z760 Encounter for issue of repeat prescription: Secondary | ICD-10-CM | POA: Insufficient documentation

## 2023-02-05 DIAGNOSIS — F209 Schizophrenia, unspecified: Secondary | ICD-10-CM | POA: Insufficient documentation

## 2023-02-05 DIAGNOSIS — Z91148 Patient's other noncompliance with medication regimen for other reason: Secondary | ICD-10-CM | POA: Insufficient documentation

## 2023-02-05 NOTE — Discharge Instructions (Addendum)
 I think f/u with walk-in clinic

## 2023-02-05 NOTE — ED Provider Notes (Signed)
 Behavioral Health Urgent Care Medical Screening Exam  Patient Name: Jeff Yang MRN: [id number] Date of Evaluation: 02/05/23 Chief Complaint:  requesting to get back on medication  Diagnosis:  Final diagnoses:  Encounter for medication refill  Anxious mood  H/O medication noncompliance    History of Present illness: Jeff Yang is a 38 y.o. male. With no documented psychiatric history,  presented to [facility] accompanied by his girlfriend.  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  Patient did not remember the name of the medication that he was taking.  Patient stated he never had a psychiatric provider or therapist.  Patient stated he is just trying to get back on the medicine but he is not looking to be established with anyone.  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  Patient stated that he does have pain in his back and this given anxiety at times.   Face-to-face evaluation of patient, patient is alert and oriented x 4, speech is clear, maintaining eye contact.  Patient denies SI, HI, AVH or paranoia.  Denies access to guns and denies any plans to hurt himself or others.  Patient denies alcohol use.  Writer discussed with patient walking psychiatry clinic and that he would need to return in the a.m. so he can be seen and probably get established as a patient if they are taking new patients.  However patient stated he did not really want to be established he just needed to get back on the medicine.  Discussed with patient that he would also need to get a full evaluation to see if he has schizophrenia.  Patient is advised to call 911 or return to the nearest ED should he experience any suicidal ideation/homicidal thoughts/hallucination.  Patient verbalized  understanding.  Writer also discussed with Dr. Cambronne MD, the patient case.  We both agreed that patient did not meet inpatient criteria and can safely be discharged.  For patient to follow up with outpatient psychiatry/walk-in clinic.  Recommend discharge  Flowsheet Row ED from 02/05/2023 in [facility]  C-SSRS RISK CATEGORY No Risk       Psychiatric Specialty Exam  Presentation  General Appearance:Casual  Eye Contact:Good  Speech:Clear and Coherent  Speech Volume:Normal  Handedness:Right   Mood and Affect  Mood: Anxious  Affect: Congruent   Thought Process  Thought Processes: Coherent  Descriptions of Associations:Intact  Orientation:Full (Time, Place and Person)  Thought Content:Logical    Hallucinations:None  Ideas of Reference:None  Suicidal Thoughts:No  Homicidal Thoughts:No   Sensorium  Memory: Immediate Fair  Judgment: Fair  Insight: Fair   Art Therapist  Concentration: Fair  Attention Span: Fair  Recall: [employer] of Knowledge: Fair  Language: Fair   Psychomotor Activity  Psychomotor Activity: Normal   Assets  Assets: Desire for Improvement; Vocational/Educational   Sleep  Sleep: Fair  Number of hours:  6   Physical Exam: Physical Exam HENT:     Head: Normocephalic.     Nose: Nose normal.  Eyes:     Pupils: Pupils are equal, round, and reactive to light.  Cardiovascular:     Rate and Rhythm: Normal rate.  Pulmonary:     Effort: Pulmonary effort is normal.  Musculoskeletal:  General: Normal range of motion.     Cervical back: Normal range of motion.  Skin:    General: Skin is warm.  Neurological:     General: No focal deficit present.     Mental Status: He is alert.  Psychiatric:        Mood and Affect: Mood normal.   Review of Systems  Constitutional: Negative.   HENT: Negative.    Eyes: Negative.   Respiratory: Negative.    Cardiovascular:  Negative.   Gastrointestinal: Negative.   Genitourinary: Negative.   Musculoskeletal: Negative.   Skin: Negative.   Neurological: Negative.   Psychiatric/Behavioral:  The patient is nervous/anxious.    Blood pressure 121/72, pulse 100, temperature 97.9 F (36.6 C), temperature source Oral, resp. rate 18, SpO2 100%. There is no height or weight on file to calculate BMI.  Musculoskeletal: Strength & Muscle Tone: within normal limits Gait & Station: normal Patient leans: N/A   [facility] MSE Discharge Disposition for Follow up and Recommendations: Based on my evaluation the patient does not appear to have an emergency medical condition and can be discharged with resources and follow up care in outpatient services for Medication Management   Gaither Pouch, NP 02/05/2023, 11:57 PM
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
## 2023-02-19 ENCOUNTER — Ambulatory Visit (INDEPENDENT_AMBULATORY_CARE_PROVIDER_SITE_OTHER): Payer: Medicaid Other | Admitting: Physician Assistant

## 2023-02-19 VITALS — BP 139/97 | HR 78 | Temp 98.4°F | Ht 72.0 in | Wt 204.6 lb

## 2023-02-19 DIAGNOSIS — F250 Schizoaffective disorder, bipolar type: Secondary | ICD-10-CM

## 2023-02-19 DIAGNOSIS — Z758 Other problems related to medical facilities and other health care: Secondary | ICD-10-CM | POA: Diagnosis not present

## 2023-02-19 DIAGNOSIS — F411 Generalized anxiety disorder: Secondary | ICD-10-CM
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
## 2023-02-20 DIAGNOSIS — F411 Generalized anxiety disorder: Secondary | ICD-10-CM | POA: Insufficient documentation

## 2023-02-20 DIAGNOSIS — F250 Schizoaffective disorder, bipolar type: Secondary | ICD-10-CM | POA: Insufficient documentation

## 2023-02-20 DIAGNOSIS — Z758 Other problems related to medical facilities and other health care: Secondary | ICD-10-CM | POA: Insufficient documentation

## 2023-02-21 ENCOUNTER — Telehealth (HOSPITAL_COMMUNITY): Payer: Self-pay | Admitting: *Deleted

## 2023-02-21 NOTE — Telephone Encounter (Signed)
Patients "friend" called to let the MD know that they were unable to obtain his medication records. The only thing they know is he was inpatient 09/24/22 for Schizophrenia, abdominal pain and headache at [facility]. Currently not on any medication and would like to restart meds.

## 2023-02-22 ENCOUNTER — Encounter (HOSPITAL_COMMUNITY): Payer: Self-pay | Admitting: Physician Assistant

## 2023-02-26 ENCOUNTER — Telehealth (HOSPITAL_COMMUNITY): Payer: Self-pay | Admitting: Physician Assistant

## 2023-04-02 ENCOUNTER — Ambulatory Visit (INDEPENDENT_AMBULATORY_CARE_PROVIDER_SITE_OTHER): Payer: MEDICAID | Admitting: Physician Assistant

## 2023-04-02 ENCOUNTER — Encounter (HOSPITAL_COMMUNITY): Payer: Self-pay | Admitting: Physician Assistant

## 2023-04-02 VITALS — BP 138/93 | HR 114 | Temp 98.3°F | Ht 72.0 in | Wt 204.0 lb

## 2023-04-02 DIAGNOSIS — F411 Generalized anxiety disorder: Secondary | ICD-10-CM

## 2023-04-02 DIAGNOSIS — F250 Schizoaffective disorder, bipolar type: Secondary | ICD-10-CM | POA: Diagnosis not present

## 2023-04-02 DIAGNOSIS — F4310 Post-traumatic stress disorder, unspecified: Secondary | ICD-10-CM | POA: Insufficient documentation

## 2023-04-02 DIAGNOSIS — Z758 Other problems related to medical facilities and other health care: Secondary | ICD-10-CM

## 2023-04-02 DIAGNOSIS — Z87820 Personal history of traumatic brain injury: Secondary | ICD-10-CM | POA: Insufficient documentation

## 2023-04-02 DIAGNOSIS — G8929 Other chronic pain: Secondary | ICD-10-CM | POA: Insufficient documentation

## 2023-04-02 DIAGNOSIS — M545 Low back pain, unspecified: Secondary | ICD-10-CM

## 2023-04-02 MED ORDER — BUSPIRONE HCL 5 MG PO TABS: 5.0000 mg | ORAL_TABLET | Freq: Two times a day (BID) | ORAL | 1 refills | Status: AC

## 2023-04-02 MED ORDER — HALOPERIDOL 10 MG PO TABS: 10.0000 mg | ORAL_TABLET | Freq: Two times a day (BID) | ORAL | 1 refills | Status: AC

## 2023-04-02 MED ORDER — NAPROXEN 500 MG PO TABS: 500.0000 mg | ORAL_TABLET | Freq: Two times a day (BID) | ORAL | 1 refills | Status: AC

## 2023-04-02 MED ORDER — SERTRALINE HCL 50 MG PO TABS: 50.0000 mg | ORAL_TABLET | Freq: Every day | ORAL | 1 refills | Status: AC

## 2023-04-02 NOTE — Progress Notes (Unsigned)
 [facility] MD/PA/NP OP Progress Note  04/02/2023 10:15 PM Jeff Yang  MRN:  [id number]  Chief Complaint:  Chief Complaint  Patient presents with   Follow-up   Medication Management   HPI: ***  Jeff Yang  Visit Diagnosis:    ICD-10-CM   1. Schizoaffective disorder, bipolar type (HCC)  F25.0 haloperidol (HALDOL) 10 MG tablet    2. Generalized anxiety disorder  F41.1 sertraline (ZOLOFT) 50 MG tablet    busPIRone (BUSPAR) 5 MG tablet    3. PTSD (post-traumatic stress disorder)  F43.10 sertraline (ZOLOFT) 50 MG tablet    4. Chronic midline low back pain, unspecified whether sciatica present  M54.50 Ambulatory referral to Pain Clinic   G89.29 naproxen (NAPROSYN) 500 MG tablet    5. History of multiple concussions  Z87.820 Ambulatory referral to Neurology    6. Does not have primary care provider  Z75.8 Ambulatory Referral to Primary Care      Past Psychiatric History:  Patient endorses a past psychiatric history significant for schizophrenia and bipolar disorder   Patient reports that he was recently hospitalized at [facility]  - Patient was hospitalized on 03/06/2023, 03/08/2023, and 03/22/2023  - During discharge, patient was placed on the following medications:   - Haldol 10 mg 2 times daily   - Buspirone 10 mg 2 times daily   - Diclofenac 75 mg 2 times daily   - Meloxicam 7.5 mg daily   - Naproxen 500 mg 2 times daily   Patient denies a past history of suicide attempt   Patient denies a past history of homicide attempt  Past Medical History:  Past Medical History:  Diagnosis Date   Drug abuse (HCC)    Shoulder dislocation    History reviewed. No pertinent surgical history.  Family Psychiatric History:  Patient reports that a few family members have mental illness but does not know their diagnoses   Family history of suicide attempt: Patient denies Family history of homicide attempt: Patient denies Family history  of substance abuse: Patient endorses a family history of substance abuse but does not know what his family members abused  Family History: History reviewed. No pertinent family history.  Social History:  Social History   Socioeconomic History   Marital status: Single    Spouse name: Not on file   Number of children: Not on file   Years of education: Not on file   Highest education level: Not on file  Occupational History   Not on file  Tobacco Use   Smoking status: Some Days    Types: Cigars   Smokeless tobacco: Never  Substance and Sexual Activity   Alcohol use: Yes    Comment: occ   Drug use: Yes    Types: Methamphetamines   Sexual activity: Yes  Other Topics Concern   Not on file  Social History Narrative   Not on file   Social Drivers of Health   Financial Resource Strain: Not on file  Food Insecurity: Not on file  Transportation Needs: Not on file  Physical Activity: Not on file  Stress: Not on file  Social Connections: Not on file    Allergies:  Allergies  Allergen Reactions   Tramadol     Upset stomach     Metabolic Disorder Labs: No results found for: "HGBA1C", "MPG" No results found for: "PROLACTIN" No results found for: "CHOL", "TRIG", "HDL", "CHOLHDL", "VLDL", "LDLCALC" No results found for: "TSH"  Therapeutic Level Labs:  No results found for: "LITHIUM" No results found for: "VALPROATE" No results found for: "CBMZ"  Current Medications: Current Outpatient Medications  Medication Sig Dispense Refill   busPIRone (BUSPAR) 5 MG tablet Take 1 tablet (5 mg total) by mouth 2 (two) times daily. 60 tablet 1   haloperidol (HALDOL) 10 MG tablet Take 1 tablet (10 mg total) by mouth 2 (two) times daily. 60 tablet 1   naproxen (NAPROSYN) 500 MG tablet Take 1 tablet (500 mg total) by mouth 2 (two) times daily with a meal. 60 tablet 1   sertraline (ZOLOFT) 50 MG tablet Take 1 tablet (50 mg total) by mouth daily. 30 tablet 1   cyclobenzaprine (FLEXERIL) 10  MG tablet Take 1 tablet (10 mg total) by mouth 2 (two) times daily as needed for muscle spasms. 20 tablet 0   meloxicam (MOBIC) 7.5 MG tablet Take 1 tablet (7.5 mg total) by mouth daily. 5 tablet 0   naproxen (NAPROSYN) 500 MG tablet Take 1 tablet (500 mg total) by mouth 2 (two) times daily. 30 tablet 0   ondansetron (ZOFRAN ODT) 8 MG disintegrating tablet Take 1 tablet (8 mg total) by mouth every 8 (eight) hours as needed for nausea or vomiting. 10 tablet 0   pantoprazole (PROTONIX) 40 MG tablet Take 1 tablet (40 mg total) by mouth daily for 30 days. 30 tablet 0   No current facility-administered medications for this visit.     Musculoskeletal: Strength & Muscle Tone: within normal limits Gait & Station: normal Patient leans: N/A  Psychiatric Specialty Exam: Review of Systems  Psychiatric/Behavioral:  Positive for dysphoric mood and sleep disturbance. Negative for decreased concentration, hallucinations, self-injury and suicidal ideas. The patient is nervous/anxious. The patient is not hyperactive.     Blood pressure (!) 138/93, pulse (!) 114, temperature 98.3 F (36.8 C), temperature source Oral, height 6' (1.829 m), weight 204 lb (92.5 kg), SpO2 100%.Body mass index is 27.67 kg/m.  General Appearance: Casual  Eye Contact:  Fair  Speech:  Clear and Coherent and Normal Rate  Volume:  Normal  Mood:  Anxious and Depressed  Affect:  Congruent  Thought Process:  Coherent, Goal Directed, and Descriptions of Associations: Intact  Orientation:  Full (Time, Place, and Person)  Thought Content: WDL   Suicidal Thoughts:  No  Homicidal Thoughts:  No  Memory:  Immediate;   Good Recent;   Good Remote;   Fair  Judgement:  Good  Insight:  Good  Psychomotor Activity:  Normal  Concentration:  Concentration: Good and Attention Span: Good  Recall:  Good  Fund of Knowledge: Good  Language: Good  Akathisia:  No  Handed:  Ambidextrous  AIMS (if indicated): not done  Assets:  Communication  Skills Desire for Improvement Housing Social Support Transportation Vocational/Educational  ADL's:  Intact  Cognition: WNL  Sleep:  Poor   Screenings: GAD-7    Flowsheet Row Clinical Support from 04/02/2023 in [facility] Office Visit from 02/19/2023 in [facility]  Total GAD-7 Score 21 21      PHQ2-9    Flowsheet Row Clinical Support from 04/02/2023 in [facility] Office Visit from 02/19/2023 in [facility]  PHQ-2 Total Score 5 5  PHQ-9 Total Score 23 24      Flowsheet Row Clinical Support from 04/02/2023 in [facility] Office Visit from 02/19/2023 in [facility] from 02/05/2023 in [facility]  C-SSRS RISK CATEGORY Low Risk Low Risk No Risk        Assessment and Plan: ***  ***  Collaboration of Care: Collaboration of Care: Medication Management AEB provider managing patient's psychiatric medications, Primary Care Provider AEB patient being referred to a primary care provider, Psychiatrist AEB patient being seen by a mental health provider at this facility, and Other provider involved in patient's care AEB patient is being referred to pain management and neurology  Patient/Guardian was advised Release of Information must be obtained prior to any record release in order to collaborate their care with an outside provider. Patient/Guardian was advised if they have not already done so to contact the registration department to sign all necessary forms in order for [place] to release information regarding their care.   Consent: Patient/Guardian gives verbal consent for treatment and assignment of benefits for services provided during this visit. Patient/Guardian expressed understanding and agreed to proceed.   1. Schizoaffective disorder, bipolar type (HCC) (Primary)  - haloperidol  (HALDOL) 10 MG tablet; Take 1 tablet (10 mg total) by mouth 2 (two) times daily.  Dispense: 60 tablet; Refill: 1  2. Generalized anxiety disorder  - sertraline (ZOLOFT) 50 MG tablet; Take 1 tablet (50 mg total) by mouth daily.  Dispense: 30 tablet; Refill: 1 - busPIRone (BUSPAR) 5 MG tablet; Take 1 tablet (5 mg total) by mouth 2 (two) times daily.  Dispense: 60 tablet; Refill: 1  3. PTSD (post-traumatic stress disorder)  - sertraline (ZOLOFT) 50 MG tablet; Take 1 tablet (50 mg total) by mouth daily.  Dispense: 30 tablet; Refill: 1  4. Chronic midline low back pain, unspecified whether sciatica present  - Ambulatory referral to Pain Clinic - naproxen (NAPROSYN) 500 MG tablet; Take 1 tablet (500 mg total) by mouth 2 (two) times daily with a meal.  Dispense: 60 tablet; Refill: 1  5. History of multiple concussions  - Ambulatory referral to Neurology  6. Does not have primary care provider  - Ambulatory Referral to Primary Care  Patient to follow up with 6 weeks Provider spent a total of 23 minutes with the patient/reviewing patient's chart  Meta Hatchet, PA 04/02/2023, 10:15 PM

## 2023-05-01 ENCOUNTER — Encounter (HOSPITAL_COMMUNITY): Payer: Self-pay | Admitting: Psychiatry

## 2023-05-01 ENCOUNTER — Emergency Department (HOSPITAL_COMMUNITY)
Admission: EM | Admit: 2023-05-01 | Discharge: 2023-05-02 | Disposition: A | Discharge status: Other Acute Inpatient Hospital | Payer: MEDICAID | Attending: Emergency Medicine | Admitting: Emergency Medicine

## 2023-05-01 DIAGNOSIS — F259 Schizoaffective disorder, unspecified: Secondary | ICD-10-CM | POA: Diagnosis present

## 2023-05-01 DIAGNOSIS — R410 Disorientation, unspecified: Secondary | ICD-10-CM

## 2023-05-01 DIAGNOSIS — F250 Schizoaffective disorder, bipolar type: Secondary | ICD-10-CM | POA: Diagnosis not present

## 2023-05-01 HISTORY — DX: Other psychoactive substance abuse, uncomplicated: F19.10

## 2023-05-01 HISTORY — DX: Anxiety disorder, unspecified: F41.9

## 2023-05-01 HISTORY — DX: Unspecified psychosis not due to a substance or known physiological condition: F29

## 2023-05-01 HISTORY — DX: Concussion with loss of consciousness status unknown, initial encounter: S06.0XAA

## 2023-05-01 LAB — CBC WITH DIFFERENTIAL/PLATELET
Abs Immature Granulocytes: 0.02 10*3/uL (ref 0.00–0.07)
Basophils Absolute: 0 10*3/uL (ref 0.0–0.1)
Basophils Relative: 1 %
Eosinophils Absolute: 0.1 10*3/uL (ref 0.0–0.5)
Eosinophils Relative: 1 %
HCT: 46.8 % (ref 39.0–52.0)
Hemoglobin: 15.8 g/dL (ref 13.0–17.0)
Immature Granulocytes: 0 %
Lymphocytes Relative: 22 %
Lymphs Abs: 1.4 10*3/uL (ref 0.7–4.0)
MCH: 31.5 pg (ref 26.0–34.0)
MCHC: 33.8 g/dL (ref 30.0–36.0)
MCV: 93.4 fL (ref 80.0–100.0)
Monocytes Absolute: 0.7 10*3/uL (ref 0.1–1.0)
Monocytes Relative: 10 %
Neutro Abs: 4.4 10*3/uL (ref 1.7–7.7)
Neutrophils Relative %: 66 %
Platelets: 414 10*3/uL — ABNORMAL HIGH (ref 150–400)
RBC: 5.01 MIL/uL (ref 4.22–5.81)
RDW: 11.9 % (ref 11.5–15.5)
WBC: 6.6 10*3/uL (ref 4.0–10.5)
nRBC: 0 % (ref 0.0–0.2)

## 2023-05-01 LAB — COMPREHENSIVE METABOLIC PANEL WITH GFR
ALT: 22 U/L (ref 0–44)
AST: 31 U/L (ref 15–41)
Albumin: 4.6 g/dL (ref 3.5–5.0)
Alkaline Phosphatase: 44 U/L (ref 38–126)
Anion gap: 18 — ABNORMAL HIGH (ref 5–15)
BUN: 21 mg/dL — ABNORMAL HIGH (ref 6–20)
CO2: 16 mmol/L — ABNORMAL LOW (ref 22–32)
Calcium: 9.1 mg/dL (ref 8.9–10.3)
Chloride: 98 mmol/L (ref 98–111)
Creatinine, Ser: 1.28 mg/dL — ABNORMAL HIGH (ref 0.61–1.24)
GFR, Estimated: >60 mL/min (ref >60)
Glucose, Bld: 138 mg/dL — ABNORMAL HIGH (ref 70–99)
Potassium: 3.2 mmol/L — ABNORMAL LOW (ref 3.5–5.1)
Sodium: 132 mmol/L — ABNORMAL LOW (ref 135–145)
Total Bilirubin: 1.1 mg/dL (ref 0.0–1.2)
Total Protein: 8.2 g/dL — ABNORMAL HIGH (ref 6.5–8.1)

## 2023-05-01 LAB — BASIC METABOLIC PANEL WITH GFR
Anion gap: 8 (ref 5–15)
BUN: 25 mg/dL — ABNORMAL HIGH (ref 6–20)
CO2: 28 mmol/L (ref 22–32)
Calcium: 9.5 mg/dL (ref 8.9–10.3)
Chloride: 100 mmol/L (ref 98–111)
Creatinine, Ser: 1.29 mg/dL — ABNORMAL HIGH (ref 0.61–1.24)
GFR, Estimated: >60 mL/min (ref >60)
Glucose, Bld: 94 mg/dL (ref 70–99)
Potassium: 4 mmol/L (ref 3.5–5.1)
Sodium: 136 mmol/L (ref 135–145)

## 2023-05-01 LAB — CBG MONITORING, ED: Glucose-Capillary: 135 mg/dL — ABNORMAL HIGH (ref 70–99)

## 2023-05-01 LAB — SALICYLATE LEVEL: Salicylate Lvl: <7 mg/dL — ABNORMAL LOW (ref 7.0–30.0)

## 2023-05-01 LAB — RAPID URINE DRUG SCREEN, HOSP PERFORMED
Amphetamines: POSITIVE — AB
Barbiturates: NOT DETECTED
Benzodiazepines: NOT DETECTED
Cocaine: NOT DETECTED
Opiates: NOT DETECTED
Tetrahydrocannabinol: POSITIVE — AB

## 2023-05-01 LAB — ACETAMINOPHEN LEVEL: Acetaminophen (Tylenol), Serum: <10 ug/mL — ABNORMAL LOW (ref 10–30)

## 2023-05-01 LAB — ETHANOL: Alcohol, Ethyl (B): <10 mg/dL (ref <10)

## 2023-05-01 MED ORDER — HALOPERIDOL LACTATE 5 MG/ML IJ SOLN: 5.0000 mg | Freq: Four times a day (QID) | INTRAMUSCULAR | Status: DC | PRN

## 2023-05-01 MED ORDER — LORAZEPAM 1 MG PO TABS: 1.0000 mg | ORAL_TABLET | ORAL | Status: DC | PRN

## 2023-05-01 MED ORDER — BUSPIRONE HCL 10 MG PO TABS
5.0000 mg | ORAL_TABLET | Freq: Two times a day (BID) | ORAL | Status: DC
  Administered 2023-05-01: 5 mg via ORAL
  Filled 2023-05-01 (×2): qty 1

## 2023-05-01 MED ORDER — LORAZEPAM 1 MG PO TABS: 2.0000 mg | ORAL_TABLET | Freq: Four times a day (QID) | ORAL | Status: DC | PRN

## 2023-05-01 MED ORDER — ZIPRASIDONE MESYLATE 20 MG IM SOLR
20.0000 mg | INTRAMUSCULAR | Status: AC | PRN
  Administered 2023-05-01: 20 mg via INTRAMUSCULAR
  Filled 2023-05-01: qty 20

## 2023-05-01 MED ORDER — LORAZEPAM 2 MG/ML IJ SOLN: 2.0000 mg | Freq: Four times a day (QID) | INTRAMUSCULAR | Status: DC | PRN

## 2023-05-01 MED ORDER — STERILE WATER FOR INJECTION IJ SOLN
INTRAMUSCULAR | Status: AC
  Filled 2023-05-01: qty 10

## 2023-05-01 MED ORDER — HALOPERIDOL 5 MG PO TABS: 5.0000 mg | ORAL_TABLET | Freq: Four times a day (QID) | ORAL | Status: DC | PRN

## 2023-05-01 MED ORDER — SERTRALINE HCL 50 MG PO TABS
50.0000 mg | ORAL_TABLET | Freq: Every day | ORAL | Status: DC
  Administered 2023-05-01: 50 mg via ORAL
  Filled 2023-05-01 (×2): qty 1

## 2023-05-01 MED ORDER — OLANZAPINE 10 MG PO TBDP: 10.0000 mg | ORAL_TABLET | Freq: Three times a day (TID) | ORAL | Status: DC | PRN

## 2023-05-01 MED ORDER — QUETIAPINE FUMARATE ER 300 MG PO TB24
300.0000 mg | ORAL_TABLET | Freq: Every day | ORAL | Status: DC
  Administered 2023-05-01: 300 mg via ORAL
  Filled 2023-05-01: qty 1

## 2023-05-01 MED ORDER — DIPHENHYDRAMINE HCL 50 MG/ML IJ SOLN: 50.0000 mg | Freq: Four times a day (QID) | INTRAMUSCULAR | Status: DC | PRN

## 2023-05-01 MED ORDER — DIPHENHYDRAMINE HCL 25 MG PO CAPS: 50.0000 mg | ORAL_CAPSULE | Freq: Four times a day (QID) | ORAL | Status: DC | PRN

## 2023-05-01 NOTE — ED Provider Notes (Signed)
 [place] EMERGENCY DEPARTMENT AT [facility] Provider Note  CSN: [id number] Arrival date & time: 05/01/23 0121  Chief Complaint(s) OD  AMS Pt arrived from his vehicle in a parking lot at [employer] and [employer] for possible OD and AMS. Bystanders called 911 after finding pt "passed out" in his car. Through water on pt to wake him and pt became very combative. When EMS and [facility] arrived pt brocaded himself in his car and would not unlock the doors. Pt has been somnolent with involuntary movement, but has been maintaining his own airway, placed on supplemental 02 for "comfort" per EMS. Will respond to some painful stimuli.   Crack pipe found in car along with several pills bottles that are unlabeled, appears may be some of his psych meds, unsure if this was a SI attempt or accidental OD.   120 HR 100 2L [place] (comfort) 150/100 156 cbg  HPI Jeff Yang is a 38 y.o. male patient brought in by [employer].  He was found acting erratic and exposing himself to coworkers.  Multiple medications and unmarked bottles found in the car.  Crack pipe also found in the car door.  No signs of trauma noted on exam.  HPI  Past Medical History No past medical history on file. There are no active problems to display for this patient.  Home Medication(s) Prior to Admission medications   Medication Sig Start Date End Date Taking? Authorizing Provider  busPIRone (BUSPAR) 5 MG tablet Take 5 mg by mouth 2 (two) times daily. 04/05/23  Yes [provider]  haloperidol (HALDOL) 10 MG tablet Take 10 mg by mouth 2 (two) times daily. 04/05/23  Yes [provider]  naproxen (NAPROSYN) 500 MG tablet Take 500 mg by mouth 2 (two) times daily. 04/05/23  Yes [provider]  sertraline (ZOLOFT) 50 MG tablet Take 50 mg by mouth daily. 04/05/23  Yes [provider]                                                                                                                                     Allergies Patient has no allergy information on record.  Review of Systems Review of Systems As noted in HPI  Physical Exam Vital Signs  I have reviewed the triage vital signs BP (!) 148/67   Pulse 87   Temp 98 F (36.7 C) (Oral)   Resp 17   Ht 6' (1.829 m)   Wt 97.5 kg   SpO2 100%   BMI 29.16 kg/m   Physical Exam Vitals reviewed.  Constitutional:      General: He is not in acute distress.    Appearance: He is well-developed. He is not diaphoretic.  HENT:     Head: Normocephalic and atraumatic.     Nose: Nose normal.  Eyes:     General: No scleral icterus.       Right eye:  No discharge.        Left eye: No discharge.     Conjunctiva/sclera: Conjunctivae normal.     Pupils: Pupils are equal, round, and reactive to light.  Cardiovascular:     Rate and Rhythm: Regular rhythm. Tachycardia present.     Heart sounds: No murmur heard.    No friction rub. No gallop.  Pulmonary:     Effort: Pulmonary effort is normal. No respiratory distress.     Breath sounds: Normal breath sounds. No stridor. No rales.  Abdominal:     General: There is no distension.     Palpations: Abdomen is soft.     Tenderness: There is no abdominal tenderness.  Musculoskeletal:        General: No tenderness.     Cervical back: Normal range of motion and neck supple.  Skin:    General: Skin is warm and dry.     Findings: No erythema or rash.  Neurological:     Comments: Responds to painful stimuli Moves all extremities     ED Results and Treatments Labs (all labs ordered are listed, but only abnormal results are displayed) Labs Reviewed  COMPREHENSIVE METABOLIC PANEL WITH GFR - Abnormal; Notable for the following components:      Result Value   Sodium 132 (*)    Potassium 3.2 (*)    CO2 16 (*)    Glucose, Bld 138 (*)    BUN 21 (*)    Creatinine, Ser 1.28 (*)    Total Protein 8.2 (*)    Anion gap 18 (*)    All other components within normal limits  SALICYLATE LEVEL -  Abnormal; Notable for the following components:   Salicylate Lvl <7.0 (*)    All other components within normal limits  ACETAMINOPHEN LEVEL - Abnormal; Notable for the following components:   Acetaminophen (Tylenol), Serum <10 (*)    All other components within normal limits  CBC WITH DIFFERENTIAL/PLATELET - Abnormal; Notable for the following components:   Platelets 414 (*)    All other components within normal limits  CBG MONITORING, ED - Abnormal; Notable for the following components:   Glucose-Capillary 135 (*)    All other components within normal limits  ETHANOL  RAPID URINE DRUG SCREEN, HOSP PERFORMED                                                                                                                         EKG  EKG Interpretation Date/Time:    Ventricular Rate:    PR Interval:    QRS Duration:    QT Interval:    QTC Calculation:   R Axis:      Text Interpretation:         Radiology No results found.  Medications Ordered in ED Medications - No data to display Procedures Procedures  (including critical care time) Medical Decision Making / ED Course   Medical Decision Making Amount and/or Complexity of Data Reviewed Labs:  ordered. Decision-making details documented in ED Course.    Altered mental status.  Patient does have a history of psychiatric disorder.  Also considering substance abuse induced delirium. Labs obtained and grossly reassuring without contributing electrolyte derangements.  Ethanol normal. Patient allowed to metabolize to freedom.  He is now more awake and alert.  Answers questions appropriately. Tangential speech. Medically clear for behavioral health evaluation    Final Clinical Impression(s) / ED Diagnoses Final diagnoses:  None    This chart was dictated using voice recognition software.  Despite best efforts to proofread,  errors can occur which can change the documentation meaning.    Lindle Rhea,  MD 05/01/23 [phone]

## 2023-05-01 NOTE — Progress Notes (Signed)
 LCSW Progress Note:      Jeff Yang  MRN: [id number]  05/01/2023 9:36 PM  Received a call from the intake coordinator, Jyl Or, at [facility]. The patient has been accepted for admission on 05/02/2023, after 8am The patient meets the criteria for inpatient psychiatric treatment, as per Kyra Weber, NP. The patient is to arrive at the [facility], [address], and the accepting provider is Percilla Boys, MD. The contact number for nurse report is [phone]. The patient's care team has been informed of these updates.

## 2023-05-01 NOTE — Consult Note (Signed)
 [facility] Psychiatric Consult Initial  Patient Name: .Jeff Yang  MRN: [id number]  DOB: Jun 15, 1985  Consult Order details:  Orders (From admission, onward)     Start     Ordered   05/01/23 0846  CONSULT TO CALL ACT TEAM       Ordering Provider: Nira Conn, MD  Provider:  (Not yet assigned)  Question:  Reason for Consult?  Answer:  Psych consult   05/01/23 0845             Mode of Visit: In person    Psychiatry Consult Evaluation  Service Date: May 01, 2023 LOS:  LOS: 0 days  Chief Complaint "I don't know"  Primary Psychiatric Diagnoses  Schizoaffective disorder  Assessment  Jeff Yang is a 38 y.o. male admitted: Presented to the EDfor 05/01/2023  1:22 AM for brought in by EMS and [employer] after being found "passed out" in his car. He carries the psychiatric diagnoses of schizoaffective disorder bipolar type, GAD and PTSD and has a past medical history of  chronic low back pain and multiple concussions.   His current presentation of acute psychosis and aggression is most consistent with decompensated schizoaffective disorder. He meets criteria for inpatient psychiatric hospitalization based on being acutely psychotic.  Current outpatient psychotropic medications include haldol, buspar and zoloft and historically he has had a positive response to these medications. He was not compliant with medications prior to admission as evidenced by patient report. On initial examination, patient in uncooperative and agitated. Please see plan below for detailed recommendations.   Diagnoses:  Active Hospital problems: Principal Problem:   Schizoaffective disorder (HCC)    Plan   ## Psychiatric Medication Recommendations:  --seroquel xr 300mg  PO Q HS --buspar 5mg  PO BID --zoloft 50mg  PO Q day  ## Medical Decision Making Capacity: Not specifically addressed in this encounter  ## Further Work-up:   -- most recent EKG on 05/01/2023 had QtC of 432 -- Pertinent labwork  reviewed earlier this admission includes: CBC, CMP, acetaminophen, salicylate, alcohol and UDS   ## Disposition:-- We recommend inpatient psychiatric hospitalization after medical hospitalization. Patient has been involuntarily committed on 05/01/2023.   ## Behavioral / Environmental: -Difficult Patient (SELECT OPTIONS FROM BELOW) or Utilize compassion and acknowledge the patient's experiences while setting clear and realistic expectations for care.    ## Safety and Observation Level:  - Based on my clinical evaluation, I estimate the patient to be at low risk of self harm in the current setting. - At this time, we recommend  routine. This decision is based on my review of the chart including patient's history and current presentation, interview of the patient, mental status examination, and consideration of suicide risk including evaluating suicidal ideation, plan, intent, suicidal or self-harm behaviors, risk factors, and protective factors. This judgment is based on our ability to directly address suicide risk, implement suicide prevention strategies, and develop a safety plan while the patient is in the clinical setting. Please contact our team if there is a concern that risk level has changed.  CSSR Risk Category:C-SSRS RISK CATEGORY: No Risk  Suicide Risk Assessment: Patient has following modifiable risk factors for suicide: medication noncompliance, which we are addressing by recommending psychiatric hospitalization. Patient has following non-modifiable or demographic risk factors for suicide: male gender and psychiatric hospitalization Patient has the following protective factors against suicide: Access to outpatient mental health care  Thank you for this consult request. Recommendations have been communicated to the primary team.  We will continue  to follow at this time.   Jeff Moment, NP       History of Present Illness  Relevant Aspects of Hospital ED Course:  Admitted on 05/01/2023  for brought in by EMS and [employer] after being found "passed out" in his car. He carries the psychiatric diagnoses of schizoaffective disorder bipolar type, GAD and PTSD and has a past medical history of  chronic low back pain and multiple concussions.   Patient Report:   Jeff Yang, is seen face to face by this provider, consulted with Jeff Yang; and chart reviewed on 05/01/23.  On evaluation Jeff Yang he is in the process of getting disability.  He also Yang he is hearing voices and seeing things. He denies being suicidal or homicidal. He is not taking his medication regularly.  He is non committal and does not answer most other questions.   During evaluation Jeff Yang is laying in bed in no acute distress.  He is alert & oriented x 4, calm but irritable and  uncooperative  His mood is agitated with congruent affect.  He has normal speech, and behavior.  Objectively there is evidence of psychosis/mania or delusional thinking. Pt does appear to be responding to internal or external stimuli.  Patient is not able to converse coherently.  He denies suicidal/self-harm/homicidal ideation; patient endorses psychosis and paranoia.    Psych ROS:  Depression: UTA Anxiety:  UTA Mania (lifetime and current): UTA Psychosis: (lifetime and current): Endorses current and previous hallucinations  Collateral information:  Contacted Jeff Yang, brother, at [phone] on 05/01/2023.  Brother was able to provide information regarding patient's girlfriend, Jeff Yang, who patient lives with according to brother. He says she works at [employer] in [place].    Review of Systems  Musculoskeletal:  Positive for back pain.  Psychiatric/Behavioral:  Positive for hallucinations and substance abuse.   All other systems reviewed and are negative.    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, denies Living Situation: UTA Spiritual Hx: UTA Access to weapons/lethal means: UTA   Substance History Alcohol: UTA  Tobacco: UTA Illicit drugs: UDS+ for amphetamine and marijuana Prescription drug abuse: UTA Rehab hx: UTA  Exam Findings  Physical Exam:  Vital Signs:  Temp:  [97.1 F (36.2 C)-98.2 F (36.8 C)] 97.8 F (36.6 C) (04/09 1508) Pulse Rate:  [76-105] 87 (04/09 1508) Resp:  [6-19] 17 (04/09 1508) BP: (98-148)/(67-96) 126/91 (04/09 1324) SpO2:  [97 %-100 %] 100 % (04/09 1508) Weight:  [97.5 kg] 97.5 kg (04/09 0132) Blood pressure (!) 126/91, pulse 87, temperature 97.8 F (36.6 C), temperature source Oral, resp. rate 17, height 6' (1.829 m), weight 97.5 kg, SpO2 100%. Body mass index is 29.16 kg/m.  Physical Exam Vitals and nursing note reviewed.  Eyes:     Pupils: Pupils are equal, round, and reactive to light.  Pulmonary:     Effort: Pulmonary effort is normal.  Skin:    General: Skin is dry.  Neurological:     Mental Status: He is alert.  Psychiatric:  Attention and Perception: He perceives auditory and visual hallucinations.        Mood and Affect: Affect is labile.        Speech: Speech normal.        Behavior: Behavior is uncooperative and agitated.        Thought Content: Thought content is delusional.        Judgment: Judgment is impulsive.     Mental Status Exam: General Appearance: Disheveled  Orientation:  Other:  UTA  Memory:   UTA  Concentration:  UTA  Recall:   UTA  Attention  Other: UTA  Eye Contact:  Poor  Speech:  Clear and Coherent   Language:  Fair  Volume:  Normal  Mood: agitated  Affect:  Congruent  Thought Process:  Disorganized  Thought Content:  Hallucinations: Auditory Visual  Suicidal Thoughts:  No  Homicidal Thoughts:  No  Judgement:  Impaired  Insight:  Lacking  Psychomotor Activity:  Normal  Akathisia:  No  Fund of Knowledge:  Poor      Assets:  Others:  UTA  Cognition:  UTA  ADL's:  Intact  AIMS (if indicated):        Other History   These have been pulled in through the EMR, reviewed, and updated if appropriate.  Family History:  The patient's family history is not on file.  Medical History: History reviewed. No pertinent past medical history.  Surgical History: History reviewed. No pertinent surgical history.   Medications:   Current Facility-Administered Medications:    OLANZapine zydis (ZYPREXA) disintegrating tablet 10 mg, 10 mg, Oral, Q8H PRN **AND** LORazepam (ATIVAN) tablet 1 mg, 1 mg, Oral, PRN **AND** [COMPLETED] ziprasidone (GEODON) injection 20 mg, 20 mg, Intramuscular, PRN, Cardama, Pedro Eduardo, MD, 20 mg at 05/01/23 0915  Current Outpatient Medications:    busPIRone (BUSPAR) 5 MG tablet, Take 5 mg by mouth 2 (two) times daily., Disp: , Rfl:    haloperidol (HALDOL) 10 MG tablet, Take 10 mg by mouth 2 (two) times daily., Disp: , Rfl:    naproxen (NAPROSYN) 500 MG tablet, Take 500 mg by mouth 2 (two) times daily., Disp: , Rfl:    sertraline (ZOLOFT) 50 MG tablet, Take 50 mg by mouth daily., Disp: , Rfl:   Allergies: Allergies  Allergen Reactions   Tramadol Other (See Comments)    "Upsets the stomach"    Jeff Moment, NP

## 2023-05-01 NOTE — ED Notes (Signed)
 [facility] called about patient placement.

## 2023-05-01 NOTE — Progress Notes (Signed)
 LCSW Progress Note:    Rayden Dock  MRN: [id number]  05/01/2023 8:27 PM     Destination  Service Provider   Address Phone Fax Patient Preferred  [facility] Patient Placement   [facility] [place], [place] [phone] --  [facility]   [place] [address] [phone] --  [facility]   [address] [place], [place] [address] [phone] --  [facility]   [address] [place] [address] [phone] --  [facility]   [address] [place], [place] [address] [phone] --  [facility]   [address] [place] [address] [phone] --  [facility]   [address] [place], [place] [address] [phone] --  [facility]   [address] [place] [address] [phone] --  [facility]   [address] [place] [address] [phone] --  [facility]   [address], [place] [address] [phone] --  [facility]   [address], [place] [address] [phone] --  [facility]   [address] [place] [address] [phone] [facility]   [place] [phone] --  [facility]   [address], [place] [address] [phone] --  [facility]   [address], [place] [address] [phone] --  [facility]   [address] [place], [place] [phone] --  [facility]   [address] [place] [address] [phone] --  [facility]   [address] [place] [address] [phone] --  [facility]   [address], [place] [address] [phone] --  [facility]   [address] [place], [place] [address] [phone] --  [facility]   [address], [place] [address] [phone] --  [facility]   [address] [place] [address] [phone] --  [facility]   [address], [place] [address] [phone] --  [facility]   [address] [place] [address] [phone] --

## 2023-05-01 NOTE — ED Notes (Signed)
 [facility] called pts brother, Jeff Yang for collateral information. Pts brother was unaware that pt was in the [facility] ED. Pts brother reports that pt has a history of substance abuse, using meth amphetamine and alcohol. Per pts brother, pt has been under the care of a psychiatrist but does not know who. Pts brother believes that pt has been diagnosed with schizophrenia.   Pts brother reports that pt received some treatment for substance abuse while he was incarcerated. Pt is currently on probation. Per pts brother, pt lives with his girlfriend Print production planner) in [place]. Pts girlfriend is a Child psychotherapist at [employer] in [place]. Pts brother reports a family history of mental illness with two cousins though pts brother does not know their diagnoses.   Patt Boozer, [employer]  05/01/23

## 2023-05-01 NOTE — ED Notes (Signed)
 This RN and Thurston Flow, RN attempted to talk the patient and explain his new disposition in the hospital requiring him to change his clothing. Patient stated that he does not want to change into the burgundy scrubs provided due to "not liking the color" and "being allergic to the color".

## 2023-05-01 NOTE — ED Triage Notes (Signed)
 Pt arrived from his vehicle in a parking lot at [employer] and [employer] for possible OD and AMS. Bystanders called 911 after finding pt "passed out" in his car. Through water on pt to wake him and pt became very combative. When EMS and [employer] arrived pt brocaded himself in his car and would not unlock the doors. Pt has been somnolent with involuntary movement, but has been maintaining his own airway, placed on supplemental 02 for "comfort" per EMS. Will respond to some painful stimuli.   Crack pipe found in car along with several pills bottles that are unlabeled, appears may be some of his psych meds, unsure if this was a SI attempt or accidental OD.   120 HR 100 2L [place] (comfort) 150/100 156 cbg

## 2023-05-01 NOTE — ED Notes (Signed)
 Patient became increasingly agitated and began cleaning the room with wipes and drank some of his own urine in the urinal. Security called and at bedside attempting to deescalate the patient.

## 2023-05-01 NOTE — ED Notes (Signed)
 Patient increasingly physically aggressive despite de escalation techniques. Geodon was administered. MD notified of patient behavior and need for

## 2023-05-01 NOTE — ED Notes (Addendum)
 Pt. Starting to wake up. Pt. States he's going to have a panic attack and he takes medicine for his arm for "PTSD or something" Pt encouraged to take deep breaths and try to rest.

## 2023-05-01 NOTE — ED Notes (Signed)
 Pt became agitated, began wiping the rm and themselves with bleach wipes. Pt drank their own urine, and refused to be changed into scrubs. Pt was educated about IVC policy. Needed to be restrained and given Geodon, after they became physically aggressive.

## 2023-05-01 NOTE — ED Notes (Signed)
 Pt awake and talking. Alert and oriented to self. Pt denies SI/HI. Asked pt what happened in which they responded "I was just cooling off." Pt denies taking anything.

## 2023-05-02 ENCOUNTER — Encounter (HOSPITAL_COMMUNITY): Payer: Self-pay | Admitting: Physician Assistant

## 2023-05-02 NOTE — ED Provider Notes (Signed)
 Emergency Medicine Observation Re-evaluation Note  Jeff Yang is a 38 y.o. male, seen on rounds today.  Pt initially presented to the ED for complaints of OD  AMS Currently, the patient is asleep, no new concerns by nursing staff.  Physical Exam  BP 124/74   Pulse 90   Temp 98.2 F (36.8 C)   Resp 18   Ht 6' (1.829 m)   Wt 97.5 kg   SpO2 100%   BMI 29.16 kg/m  Physical Exam General: Asleep, no acute distress Cardiac: Regular rate Lungs: No increased WOB Psych: Calm, asleep  ED Course / MDM  EKG:EKG Interpretation Date/Time:  Wednesday May 01 2023 14:52:46 EDT Ventricular Rate:  84 PR Interval:  135 QRS Duration:  87 QT Interval:  365 QTC Calculation: 432 R Axis:   64  Text Interpretation: Sinus rhythm No significant change since last tracing Confirmed by Elayne Snare ([id number]) on 05/02/2023 7:26:13 AM  I have reviewed the labs performed to date as well as medications administered while in observation.  Recent changes in the last 24 hours include patient remains medically cleared. He was evaluated by psych and recommended for inpatient psych. He has been accepted to [facility] today by Dr. Danton Sewer.  Plan  Current plan is for inpatient psych, likely [facility] today.    Rexford Maus, DO 05/02/23 [phone]

## 2023-05-02 NOTE — ED Notes (Signed)
 Transport here, pt would not wake so pt given sternum rub and pt woke up and started smiling and reaching for my hands

## 2023-05-02 NOTE — ED Notes (Signed)
 PT had breakfast and getting meds.  [place] being completed and transport called and on the way approx 5 min ago

## 2023-05-02 NOTE — ED Notes (Signed)
 Report called to [facility] to [facility], Charity fundraiser. Transport already called for this pt.  Pt IVC.

## 2023-05-07 ENCOUNTER — Telehealth (HOSPITAL_COMMUNITY): Payer: Self-pay | Admitting: *Deleted

## 2023-05-07 NOTE — Telephone Encounter (Signed)
 Patient's girlfriend called asking for a return call, states that her boyfriend is inpatient at [facility] and would like to ask him some questions but would not discuss with this Clinical research associate. [name] [phone].

## 2023-05-08 NOTE — Telephone Encounter (Signed)
 Message acknowledged and reviewed.

## 2023-05-14 ENCOUNTER — Encounter (HOSPITAL_COMMUNITY): Payer: Self-pay | Admitting: Physician Assistant

## 2023-05-14 ENCOUNTER — Ambulatory Visit (HOSPITAL_COMMUNITY): Payer: MEDICAID | Admitting: Physician Assistant

## 2023-05-14 VITALS — BP 124/76 | HR 99 | Temp 98.3°F | Ht 72.0 in | Wt 206.2 lb

## 2023-05-14 DIAGNOSIS — F411 Generalized anxiety disorder: Secondary | ICD-10-CM

## 2023-05-14 DIAGNOSIS — F250 Schizoaffective disorder, bipolar type: Secondary | ICD-10-CM | POA: Diagnosis not present

## 2023-05-14 DIAGNOSIS — F4310 Post-traumatic stress disorder, unspecified: Secondary | ICD-10-CM

## 2023-05-14 NOTE — Progress Notes (Signed)
 BH MD/PA/NP OP Progress Note  05/14/2023 6:35 PM Jeff Yang  MRN:  [id number]  Chief Complaint:  Chief Complaint  Patient presents with   Follow-up   HPI:   Jeff Yang is a 38 year old male with a past psychiatric history significant for schizoaffective disorder (bipolar type), generalized anxiety disorder, and PTSD who presents to [facility] for follow-up.  During his last encounter, patient was being managed on the following psychiatric medications:  Buspirone  5 mg 2 times daily Haloperidol  10 mg 2 times daily Zoloft  50 mg daily  Patient was recently admitted to [facility] on 05/02/2023.  He was discharged on 05/10/2023 on the following psychiatric medications:  Buspirone  5 mg 2 times daily Hydroxyzine 25 mg every 6 hours as needed Risperidone 3 mg at bedtime Risperidone 100 mg / 0.28 mL every 30 days (Uzedy/long-acting injectable) Sertraline  50 mg daily Trazodone 50 mg at bedtime as needed  Per patient's discharge papers, patient was placed on Uzedy (risperidone injection).  Patient was given the shot on 05/09/2023.  Since being discharged from the hospital, patient reports that his medications have been a little helpful in managing his symptoms.  Patient reports that he still continues to have difficulty with focus and concentration.  He also reports that he is easily distracted, has trouble listening and following instructions, has issues with time management, exhibits forgetfulness, loses things easily, overlooks important details, impatience, mood swings, difficulty waiting his turn, and fidgeting.  Patient believes that he has these issues due to past history of traumatic head injury due to playing football.  Patient is unsure of the amount of times he was concussed while playing football.  Patient continues to endorse depression and rates his depression an 8 out of 10 with 10 being most severe.  Patient  endorses depressive episodes most days of the week.  He endorses the following depressive symptoms: feelings of sadness, decreased concentration, irritability, and hopelessness.  He denies lack of motivation or feelings of guilt/worthlessness.  Patient continues to endorse anxiety and rates his anxiety a 10 out of 10.  Patient's main stressors revolve around not being able to concentrate and being unable to maintain a job.  A PHQ-9 screen was performed with the patient scored in 19.  A GAD-7 screen was also performed with the patient scoring a 19.  When asked the patient was experiencing auditory hallucinations, patient informed provider that he hears voices that tell him he needs to keep his job.  He denies visual hallucinations but does endorse paranoia regarding his inability to focus or listen accordingly.  Patient is alert and oriented x 4, calm, cooperative, and fully engaged in conversation during the encounter.  Patient reports that he is in distress.  Patient exhibits depressed and anxious mood with congruent affect.  Patient denies suicidal or homicidal ideations.  He further denies auditory or visual hallucinations and does not appear to be responding to internal/external stimuli.  Patient endorses poor sleep and receives on average 3 hours of sleep per night.  Patient endorses fair appetite needs on average 1-2 meals per day.  Patient endorses alcohol consumption sparingly.  Patient endorses tobacco use and smokes on average a pack per day.  Patient endorses illicit drug use in the form of marijuana.  Visit Diagnosis:    ICD-10-CM   1. Schizoaffective disorder, bipolar type (HCC)  F25.0     2. Generalized anxiety disorder  F41.1     3. PTSD (post-traumatic stress disorder)  F43.10        Past Psychiatric History:  Patient endorses a past psychiatric history significant for schizophrenia and bipolar disorder  Patient was recently discharged from [facility] on 05/09/2023 Patient  reports that he was recently hospitalized at [facility]  - Patient was hospitalized on 03/06/2023, 03/08/2023, and 03/22/2023  - During discharge, patient was placed on the following medications:   - Haldol  10 mg 2 times daily   - Buspirone  10 mg 2 times daily   - Diclofenac 75 mg 2 times daily   - Meloxicam  7.5 mg daily   - Naproxen  500 mg 2 times daily   Patient denies a past history of suicide attempt   Patient denies a past history of homicide attempt  Past Medical History:  Past Medical History:  Diagnosis Date   Anxiety    Concussion    Drug abuse (HCC)    Psychosis (HCC)    Shoulder dislocation    Substance abuse (HCC)    History reviewed. No pertinent surgical history.  Family Psychiatric History:  Patient reports that a few family members have mental illness but does not know their diagnoses   Family history of suicide attempt: Patient denies Family history of homicide attempt: Patient denies Family history of substance abuse: Patient endorses a family history of substance abuse but does not know what his family members abused  Family History: History reviewed. No pertinent family history.  Social History:  Social History   Socioeconomic History   Marital status: Single    Spouse name: Not on file   Number of children: Not on file   Years of education: Not on file   Highest education level: Not on file  Occupational History   Not on file  Tobacco Use   Smoking status: Some Days   Smokeless tobacco: Never  Substance and Sexual Activity   Alcohol use: Yes    Comment: occ   Drug use: Yes    Types: Methamphetamines   Sexual activity: Yes  Other Topics Concern   Not on file  Social History Narrative   ** Merged History Encounter **       Social Drivers of Corporate investment banker Strain: Not on file  Food Insecurity: Not on file  Transportation Needs: Not on file  Physical Activity: Not on file   Stress: Not on file  Social Connections: Not on file    Allergies:  Allergies  Allergen Reactions   Tramadol      Upset stomach    Tramadol  Other (See Comments)    "Upsets the stomach"    Metabolic Disorder Labs: No results found for: "HGBA1C", "MPG" No results found for: "PROLACTIN" No results found for: "CHOL", "TRIG", "HDL", "CHOLHDL", "VLDL", "LDLCALC" No results found for: "TSH"  Therapeutic Level Labs: No results found for: "LITHIUM" No results found for: "VALPROATE" No results found for: "CBMZ"  Current Medications: Current Outpatient Medications  Medication Sig Dispense Refill   busPIRone  (BUSPAR ) 5 MG tablet Take 1 tablet (5 mg total) by mouth 2 (two) times daily. 60 tablet 1   busPIRone  (BUSPAR ) 5 MG tablet Take 5 mg by mouth 2 (two) times daily.     cyclobenzaprine  (FLEXERIL ) 10 MG tablet Take 1 tablet (10 mg total) by mouth 2 (two) times daily as needed for muscle spasms. 20 tablet 0   haloperidol  (HALDOL ) 10 MG tablet Take 1 tablet (10 mg total) by mouth 2 (two) times daily. 60  tablet 1   haloperidol  (HALDOL ) 10 MG tablet Take 10 mg by mouth 2 (two) times daily.     meloxicam  (MOBIC ) 7.5 MG tablet Take 1 tablet (7.5 mg total) by mouth daily. 5 tablet 0   naproxen  (NAPROSYN ) 500 MG tablet Take 1 tablet (500 mg total) by mouth 2 (two) times daily. 30 tablet 0   naproxen  (NAPROSYN ) 500 MG tablet Take 1 tablet (500 mg total) by mouth 2 (two) times daily with a meal. 60 tablet 1   naproxen  (NAPROSYN ) 500 MG tablet Take 500 mg by mouth 2 (two) times daily.     ondansetron  (ZOFRAN  ODT) 8 MG disintegrating tablet Take 1 tablet (8 mg total) by mouth every 8 (eight) hours as needed for nausea or vomiting. 10 tablet 0   pantoprazole  (PROTONIX ) 40 MG tablet Take 1 tablet (40 mg total) by mouth daily for 30 days. 30 tablet 0   sertraline  (ZOLOFT ) 50 MG tablet Take 1 tablet (50 mg total) by mouth daily. 30 tablet 1   sertraline  (ZOLOFT ) 50 MG tablet Take 50 mg by mouth daily.      No current facility-administered medications for this visit.     Musculoskeletal: Strength & Muscle Tone: within normal limits Gait & Station: normal Patient leans: N/A  Psychiatric Specialty Exam: Review of Systems  Psychiatric/Behavioral:  Positive for dysphoric mood and sleep disturbance. Negative for decreased concentration, hallucinations, self-injury and suicidal ideas. The patient is nervous/anxious. The patient is not hyperactive.     Blood pressure 124/76, pulse 99, temperature 98.3 F (36.8 C), temperature source Oral, height 6' (1.829 m), weight 206 lb 3.2 oz (93.5 kg), SpO2 100%.Body mass index is 27.97 kg/m.  General Appearance: Casual  Eye Contact:  Fair  Speech:  Clear and Coherent and Normal Rate  Volume:  Normal  Mood:  Anxious and Depressed  Affect:  Congruent  Thought Process:  Coherent, Goal Directed, and Descriptions of Associations: Intact  Orientation:  Full (Time, Place, and Person)  Thought Content: WDL   Suicidal Thoughts:  No  Homicidal Thoughts:  No  Memory:  Immediate;   Good Recent;   Good Remote;   Fair  Judgement:  Good  Insight:  Good  Psychomotor Activity:  Normal  Concentration:  Concentration: Good and Attention Span: Good  Recall:  Good  Fund of Knowledge: Good  Language: Good  Akathisia:  No  Handed:  Ambidextrous  AIMS (if indicated): not done  Assets:  Communication Skills Desire for Improvement Housing Social Support Transportation Vocational/Educational  ADL's:  Intact  Cognition: WNL  Sleep:  Poor   Screenings: AIMS    Flowsheet Row Clinical Support from 05/14/2023 in [facility]  AIMS Total Score 8      GAD-7    Flowsheet Row Clinical Support from 05/14/2023 in [facility] Clinical Support from 04/02/2023 in [facility] Office Visit from 02/19/2023 in [facility]  Total GAD-7 Score 19 21 21        PHQ2-9    Flowsheet Row Clinical Support from 05/14/2023 in [facility] Clinical Support from 04/02/2023 in [facility] Office Visit from 02/19/2023 in [place]  PHQ-2 Total Score 5 5 5   PHQ-9 Total Score 19 23 24       Flowsheet Row Clinical Support from 05/14/2023 in [facility] ED from 05/01/2023 in [facility] Emergency Department at [facility]  Clinical Support from 04/02/2023 in [facility]  C-SSRS RISK CATEGORY Moderate Risk No Risk Low Risk        Assessment and Plan:   Jeff Yang is a 38 year old male with a past psychiatric history significant for schizoaffective disorder (bipolar type), generalized anxiety disorder, and PTSD who presents to [facility] for follow-up. Patient was recently admitted to [facility] on 05/02/2023.  He was discharged on 05/10/2023 on the following psychiatric medications:  Buspirone  5 mg 2 times daily Hydroxyzine 25 mg every 6 hours as needed Risperidone 3 mg at bedtime Risperidone 100 mg / 0.28 mL every 30 days (Uzedy/long-acting injectable) Sertraline  50 mg daily Trazodone 50 mg at bedtime as needed  During his hospitalization, patient was placed on Uzedy (risperidone long-acting injectable) on 05/09/2023.  Patient is expected to receive a follow-up injection 30 days from his initial injection date.  Patient reports that his medications have been somewhat helpful since being discharged from the hospital.  Patient continues to endorse ongoing depression and anxiety.  He reports that this anxiety is attributed to his inability to concentrate and maintain a job.  In regards to the patient's focus, patient endorses a number of symptoms related to decreased focus and concentration.  Some of his symptoms include distractibility, trouble  listening and following instructions, overlooking important details, forgetfulness, mood swings, and impatience.  Patient reports that he has a past history of sustaining multiple concussions when he used to play football.  He informed provider that one of the providers at [facility] told him that Adderall would be helpful in managing his symptoms.  Provider to hold off on placing patient on any stimulant/nonstimulant medications until he has been thoroughly assessed by neurology.  Patient denies visual hallucinations but does endorse auditory hallucinations stating that he hears voices telling him he needs to maintain his job.  He also endorses paranoia attributed to not being able to focus or listen accordingly.  A PHQ-9 screen was performed with the patient scoring a 19.  A GAD-7 screen was also performed with the patient scoring a 19.  Patient was assessed for involuntary movements during the assessment.  No involuntary movements were observed during the duration of the encounter.  An aims assessment was performed with the patient scoring a 8.  Unsure of patient's involuntary movements are attributed to past injuries from his time of playing football.  He notes that he has stiff shoulders from past injuries sustained while playing football.  He reports that he occasionally experiences spasms.    Due to patient recently being placed on his current medication regimen (patient was discharged on 05/10/2023), patient to continue taking his medications as prescribed.  Provider to reach out to patient's girlfriend to determine if patient is taking his medications as prescribed.  Provider to assess whether or not patient's medications are effective in managing his depressive symptoms, anxiety, or auditory hallucinations.  Patient was agreeable to continuing to take his medications as prescribed.  Patient does not appear to be actively psychotic following the conclusion of the encounter.  A [place] Suicide  Severity Rating Scale was performed with the patient being considered moderate risk.  Patient denied suicidal ideations and was able to contract for safety following the conclusion of the encounter.  Patient has been referred to neurology to address past history of concussions Patient has been referred to primary care services Provider to obtain labs during patient's next encounter  Most recent  EKG performed on 05/02/2023  - Patient QTc: 432 ms  Collaboration of Care: Collaboration of Care: Medication Management AEB provider managing patient's psychiatric medications, Primary Care Provider AEB patient being referred to a primary care provider, Psychiatrist AEB patient being seen by a mental health provider at this facility, and Other provider involved in patient's care AEB patient is being referred to pain management and neurology  Patient/Guardian was advised Release of Information must be obtained prior to any record release in order to collaborate their care with an outside provider. Patient/Guardian was advised if they have not already done so to contact the registration department to sign all necessary forms in order for us  to release information regarding their care.   Consent: Patient/Guardian gives verbal consent for treatment and assignment of benefits for services provided during this visit. Patient/Guardian expressed understanding and agreed to proceed.   1. Schizoaffective disorder, bipolar type (HCC) (Primary) Patient was given Risperidone 100 mg / 0.28 mL injection on 05/09/2023.  Patient's next injection is scheduled for 06/08/2023. Patient to be enrolled into [facility] Per patient's discharge papers, it states for patient to continue taking risperidone 3 mg at bedtime.  Provider unsure if patient should continue taking risperidone 3 mg at bedtime if he is already been giving UZEDY long-acting injectable.  Provider to reach out to patient's girlfriend  for clarification.  2. Generalized anxiety disorder Patient to continue taking buspirone  5 mg 2 times daily for the management of his generalized anxiety disorder Patient to continue taking sertraline  50 mg daily for the management of his generalized anxiety disorder Patient to continue taking hydroxyzine 25 mg every 6 hours as needed for the management of his generalized anxiety disorder  3. PTSD (post-traumatic stress disorder) Patient to continue taking sertraline  50 mg daily for the management of his PTSD  Patient to continue taking trazodone 50 mg at bedtime as needed for the management of his sleep  Patient to follow up with 6 weeks Provider spent a total of 30 minutes with the patient/reviewing patient's chart  Gates Kasal, PA 05/14/2023, 6:35 PM

## 2023-05-15 NOTE — Telephone Encounter (Signed)
 Provider was able to reach out patient during his appointment yesterday. Provider to reach out to patient's girlfriend.

## 2023-05-29 ENCOUNTER — Telehealth (HOSPITAL_COMMUNITY): Payer: Self-pay

## 2023-05-29 NOTE — Telephone Encounter (Signed)
 Pt's girlfriend called today and states that she was told to call back to speak to Dr Berta Brittle. Pt did not provide me with any additional information except that she would like a referral to have a CT scan done, as Mr.Riehl has been experiencing severe headaches.     JNL, CMA

## 2023-06-13 ENCOUNTER — Encounter (HOSPITAL_COMMUNITY): Payer: Self-pay | Admitting: Emergency Medicine

## 2023-06-13 ENCOUNTER — Emergency Department (HOSPITAL_COMMUNITY)
Admission: EM | Admit: 2023-06-13 | Discharge: 2023-06-14 | Disposition: A | Discharge status: Home / Self Care | Payer: MEDICAID | Attending: Emergency Medicine | Admitting: Emergency Medicine

## 2023-06-13 ENCOUNTER — Emergency Department (HOSPITAL_COMMUNITY): Payer: MEDICAID

## 2023-06-13 ENCOUNTER — Other Ambulatory Visit: Payer: Self-pay

## 2023-06-13 DIAGNOSIS — F19959 Other psychoactive substance use, unspecified with psychoactive substance-induced psychotic disorder, unspecified: Secondary | ICD-10-CM | POA: Diagnosis not present

## 2023-06-13 DIAGNOSIS — F1510 Other stimulant abuse, uncomplicated: Secondary | ICD-10-CM | POA: Insufficient documentation

## 2023-06-13 DIAGNOSIS — R4182 Altered mental status, unspecified: Secondary | ICD-10-CM | POA: Diagnosis present

## 2023-06-13 DIAGNOSIS — F1520 Other stimulant dependence, uncomplicated: Secondary | ICD-10-CM | POA: Diagnosis present

## 2023-06-13 DIAGNOSIS — R451 Restlessness and agitation: Secondary | ICD-10-CM

## 2023-06-13 LAB — CBC WITH DIFFERENTIAL/PLATELET
Abs Immature Granulocytes: 0.03 10*3/uL (ref 0.00–0.07)
Basophils Absolute: 0 10*3/uL (ref 0.0–0.1)
Basophils Relative: 0 %
Eosinophils Absolute: 0.1 10*3/uL (ref 0.0–0.5)
Eosinophils Relative: 1 %
HCT: 41.8 % (ref 39.0–52.0)
Hemoglobin: 14.4 g/dL (ref 13.0–17.0)
Immature Granulocytes: 0 %
Lymphocytes Relative: 14 %
Lymphs Abs: 1.2 10*3/uL (ref 0.7–4.0)
MCH: 31.2 pg (ref 26.0–34.0)
MCHC: 34.4 g/dL (ref 30.0–36.0)
MCV: 90.5 fL (ref 80.0–100.0)
Monocytes Absolute: 0.8 10*3/uL (ref 0.1–1.0)
Monocytes Relative: 9 %
Neutro Abs: 6.8 10*3/uL (ref 1.7–7.7)
Neutrophils Relative %: 76 %
Platelets: 389 10*3/uL (ref 150–400)
RBC: 4.62 MIL/uL (ref 4.22–5.81)
RDW: 11.9 % (ref 11.5–15.5)
WBC: 9 10*3/uL (ref 4.0–10.5)
nRBC: 0 % (ref 0.0–0.2)

## 2023-06-13 LAB — COMPREHENSIVE METABOLIC PANEL WITH GFR
ALT: 21 U/L (ref 0–44)
AST: 37 U/L (ref 15–41)
Albumin: 4.1 g/dL (ref 3.5–5.0)
Alkaline Phosphatase: 37 U/L — ABNORMAL LOW (ref 38–126)
Anion gap: 15 (ref 5–15)
BUN: 20 mg/dL (ref 6–20)
CO2: 20 mmol/L — ABNORMAL LOW (ref 22–32)
Calcium: 9.4 mg/dL (ref 8.9–10.3)
Chloride: 101 mmol/L (ref 98–111)
Creatinine, Ser: 1.43 mg/dL — ABNORMAL HIGH (ref 0.61–1.24)
GFR, Estimated: >60 mL/min (ref >60)
Glucose, Bld: 126 mg/dL — ABNORMAL HIGH (ref 70–99)
Potassium: 3.5 mmol/L (ref 3.5–5.1)
Sodium: 136 mmol/L (ref 135–145)
Total Bilirubin: 0.9 mg/dL (ref 0.0–1.2)
Total Protein: 7.2 g/dL (ref 6.5–8.1)

## 2023-06-13 LAB — SALICYLATE LEVEL: Salicylate Lvl: <7 mg/dL — ABNORMAL LOW (ref 7.0–30.0)

## 2023-06-13 LAB — ETHANOL: Alcohol, Ethyl (B): <15 mg/dL (ref <15)

## 2023-06-13 LAB — ACETAMINOPHEN LEVEL: Acetaminophen (Tylenol), Serum: <10 ug/mL — ABNORMAL LOW (ref 10–30)

## 2023-06-13 MED ORDER — SERTRALINE HCL 50 MG PO TABS
50.0000 mg | ORAL_TABLET | Freq: Every day | ORAL | Status: DC
  Administered 2023-06-14: 50 mg via ORAL
  Filled 2023-06-13 (×2): qty 1

## 2023-06-13 MED ORDER — BUSPIRONE HCL 10 MG PO TABS
5.0000 mg | ORAL_TABLET | Freq: Two times a day (BID) | ORAL | Status: DC
  Administered 2023-06-14: 5 mg via ORAL
  Filled 2023-06-13 (×2): qty 1

## 2023-06-13 MED ORDER — HALOPERIDOL LACTATE 5 MG/ML IJ SOLN
5.0000 mg | Freq: Once | INTRAMUSCULAR | Status: AC
  Administered 2023-06-13: 5 mg via INTRAMUSCULAR
  Filled 2023-06-13: qty 1

## 2023-06-13 MED ORDER — NAPROXEN 250 MG PO TABS
500.0000 mg | ORAL_TABLET | Freq: Two times a day (BID) | ORAL | Status: DC
Start: 2023-06-14 — End: 2023-06-14
  Filled 2023-06-13: qty 2

## 2023-06-13 MED ORDER — HALOPERIDOL 5 MG PO TABS
10.0000 mg | ORAL_TABLET | Freq: Two times a day (BID) | ORAL | Status: DC
Start: 2023-06-13 — End: 2023-06-14
  Administered 2023-06-14: 10 mg via ORAL
  Filled 2023-06-13 (×2): qty 2

## 2023-06-13 MED ORDER — LORAZEPAM 2 MG/ML IJ SOLN
2.0000 mg | Freq: Once | INTRAMUSCULAR | Status: AC
  Administered 2023-06-13: 2 mg via INTRAMUSCULAR
  Filled 2023-06-13: qty 1

## 2023-06-13 NOTE — ED Notes (Addendum)
 Restraints removed. Pt calm and cooperative at this time. MD aware- order to be discontinued.
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
## 2023-06-13 NOTE — ED Notes (Signed)
 Safety sitter at bedside

## 2023-06-13 NOTE — ED Notes (Signed)
 Called Magistrate office because IVC paperwork is considered invalid. The patient's name on the first exam paperwork is correct, however it's incorrect on the Findings and custody as well as Affidavit and petition paperwork since the pt came in already IVC'd. NS/MT Reeves Canter is redoing the IVC paperwork as instructed by the [facility] office
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
## 2023-06-13 NOTE — BH Assessment (Signed)
 Patient was deferred to [facility] for a telepsych assessment. The assigned care coordinator will provide updates regarding the scheduling of the assessment. [facility] coordinator can be reached at [phone] for further information on the timing of the telepsych evaluation.

## 2023-06-13 NOTE — ED Notes (Addendum)
 Patient wanded by security. Belongings collected. Pt changed into paper scrubs.

## 2023-06-14 ENCOUNTER — Encounter (HOSPITAL_COMMUNITY): Payer: Self-pay | Admitting: Psychiatry

## 2023-06-14 DIAGNOSIS — F19959 Other psychoactive substance use, unspecified with psychoactive substance-induced psychotic disorder, unspecified: Secondary | ICD-10-CM | POA: Diagnosis present

## 2023-06-14 DIAGNOSIS — F1510 Other stimulant abuse, uncomplicated: Secondary | ICD-10-CM | POA: Diagnosis present

## 2023-06-14 DIAGNOSIS — F1520 Other stimulant dependence, uncomplicated: Secondary | ICD-10-CM | POA: Diagnosis present

## 2023-06-14 NOTE — ED Notes (Signed)
 PT refused vitals, refused to leave room, security came to remove patient via wheelchair. Provided with bus pass on discharge.

## 2023-06-14 NOTE — ED Notes (Addendum)
 TTS on line Dr. Tobe Fort, tried to speak to patient but pt unable to communicate. Will try to call when he's more awake or alert as per MD

## 2023-06-14 NOTE — ED Notes (Signed)
 Incorrect IVC paperwork is in confidential/shred grey bin in [place] zone

## 2023-06-14 NOTE — ED Notes (Signed)
 Envelope #[id number] has been submitted successfully. Findings added

## 2023-06-14 NOTE — ED Notes (Signed)
 Pt refused Vital signs recheck at this time.

## 2023-06-14 NOTE — BH Assessment (Signed)
 Per IRIS Coordinator, Yolonda Henderson, IRIS provider attempted to see pt at scheduled consult [phone]). However, pt was asleep and unable to engaged in assessment. Pt deferred back to in house provider during day shift.

## 2023-06-14 NOTE — Consult Note (Addendum)
 [facility] Psychiatric Consult Initial  Patient Name: .Jeff Yang  MRN: [id number]  DOB: 1985/06/29  Consult Order details:  Orders (From admission, onward)     Start     Ordered   06/13/23 1739  CONSULT TO CALL ACT TEAM       Ordering Provider: Dalene Duck, MD  Provider:  (Not yet assigned)  Question Answer Comment  Place call to: ACT   Reason for Consult Admit      06/13/23 1738             Mode of Visit: In person    Psychiatry Consult Evaluation  Service Date: Jun 14, 2023 LOS:  LOS: 0 days  Chief Complaint: "I'm good"   Primary Psychiatric Diagnoses  Psychoactive substance into psychosis 2.  Amphetamine abuse  Assessment   Jeff Yang is a 38 y.o. AA male with a past psychiatric history of methamphetamine abuse, and recently within the last several months, past psychiatric history of schizoaffective disorder bipolar type, generalized anxiety, and PTSD, with pertinent medical comorbidities/history that include chronic pain, who presented this encounter by way of EMS with [employer] escort under involuntary commitment, due to patient being found acting erratic and bizarre at a local nearby restaurant, where the patient was found clapping and trying to hug bystanders, and when approached by police to investigate, ran from police and began banging on windows at local businesses and cars nearby, thus was brought in for safety.  Patient currently remains under involuntary commitment at this time, but is medically clear, per EDP team.  Upon evaluation, patient presents with symptomology this event that is most consistent with psychoactive substance induced psychosis and amphetamine abuse.  Evidence is appreciable from endorsements given by the patient, where he endorses during evaluation that prior to onset of inappropriate/psychotic behavior in the community, patient utilized methamphetamines, and now no longer presents with symptomology consistent with intoxication,  psychosis, and/or concerns for safety.  Given investigation conducted, which demonstrates that the patient is no longer intoxicated or appearing under the influence of a psychoactive substance and/or experiencing psychotic features, and gives no endorsements of suicidal and or homicidal ideations, and/or auditory and or visual hallucinations, and/or additionally concerns for safety, recommendation is for psychiatric clearance, as well as additional recommendations listed below.  Patient given extensive education on recommendations to closely follow-up with his outpatient provider at the [facility] outpatient behavioral health center, encouraged to strictly adhere to safety plan created today, continue taking his current psychiatric medications, and consider addressing his substance abuse with resources given upon discharge.  Patient was minimally receptive to information provided, but did abruptly and curtly endorse understanding.   Spoke with Dr. Deborah Falling who is in agreement for psychiatric clearance, as well as additional recommendations listed below.  Diagnoses:  Active Hospital problems: Principal Problem:   Psychoactive substance-induced psychosis (HCC) Active Problems:   Amphetamine abuse (HCC)    Plan   # Psychoactive substance induced psychosis # Amphetamine abuse  ## Psychiatric Recommendations:   - Recommend continue current outpatient psychiatric medications - Recommend close outpatient follow-up with the patient's outpatient provider at the [facility] behavioral health outpatient center -Recommend strict adherence to safety plan created today - Recommend patient consider close outpatient follow-up with substance abuse resources given today - Recommend patient abstain from illicit substances  Safety Plan Jeff Yang will reach out to Charlean MacNeil, call 911 or call mobile crisis, or go to nearest emergency room if condition worsens or if suicidal thoughts become  active Patients' will follow up with [facility] for outpatient psychiatric services (therapy/medication management).  Patient will consider close outpatient follow-up with substance abuse resources given upon discharge The suicide prevention education provided includes the following: Suicide risk factors Suicide prevention and interventions National Suicide Hotline telephone number [facility] assessment telephone number [facility] Emergency Assistance 911 [facility] and/or Residential Mobile Crisis Unit telephone number  ## Medical Decision Making Capacity: Has capacity  ## Further Work-up: None at this time  ## Disposition:-- There are no psychiatric contraindications to discharge at this time  ## Behavioral / Environmental: -Strict agitation/safety precautions until discharge; upon discharge, strict adherence to safety plan    ## Safety and Observation Level:  - Based on my clinical evaluation, I estimate the patient to be at low risk of self harm in the current setting. - At this time, we recommend  routine. This decision is based on my review of the chart including patient's history and current presentation, interview of the patient, mental status examination, and consideration of suicide risk including evaluating suicidal ideation, plan, intent, suicidal or self-harm behaviors, risk factors, and protective factors. This judgment is based on our ability to directly address suicide risk, implement suicide prevention strategies, and develop a safety plan while the patient is in the clinical setting. Please contact our team if there is a concern that risk level has changed.  CSSR Risk Category:C-SSRS RISK CATEGORY: No Risk  Suicide Risk Assessment: Patient has following modifiable risk factors for suicide: recklessness, which we are addressing by treatment recommendations. Patient has following non-modifiable or demographic risk factors for suicide: male gender and  psychiatric hospitalization Patient has the following protective factors against suicide: Access to outpatient mental health care, Supportive family, Supportive friends, Frustration tolerance, no history of suicide attempts, and no history of NSSIB  Thank you for this consult request. Recommendations have been communicated to the primary team.  We will sign off at this time.   Volanda Gruber, NP       History of Present Illness   Jeff Yang is a 38 y.o. AA male with a past psychiatric history of methamphetamine abuse, and recently within the last several months, past psychiatric history of schizoaffective disorder bipolar type, generalized anxiety, and PTSD, with pertinent medical comorbidities/history that include chronic pain, who presented this encounter by way of EMS with [employer] escort under involuntary commitment, due to patient being found acting erratic and bizarre at a local nearby restaurant, where the patient was found clapping and trying to hug bystanders, and when approached by police to investigate, ran from police and began banging on windows at local businesses and cars nearby, thus was brought in for safety.  Patient currently remains under involuntary commitment at this time, but is medically clear, per EDP team.  Patient seen today at the [facility] emergency department for face-to-face psychiatric evaluation.  Upon evaluation, patient immediately, abruptly, and curtly, endorses that, "I am good", with a mildly constricted and noncongruent affect with irritable edge, reserved, guarded, and irritable interpersonal style, and minimal eye contact.  Patient asked to expand on this, but only until prompted repeatedly a number of times to expand, does the patient again state, "I am good".  Patient asked about the recent events that transpired directly, to which patient endorses, "I was high". Patient asked if he was referring to utilizing methamphetamines, given his history of  methamphetamine abuse, to which patient endorses, "abuse?" And then states after pausing for a meaningful period of  time, "yeah" (affirming he had utilized amphetamines of some sort).  Patient asked about refusing to give a urine sample to test for drug utilization, given his open endorsements of utilizing some sort of amphetamines, to which patient states abruptly and curtly, "cause I'm not."   Patient asked if what he meant by "I am good" being that he feels that he is safe to discharge from the emergency department to follow-up with outpatient services he is connected with, per chart review, to which patient endorses that he feels that he is, and states that, "I already got my medicine".  Patient formally asked if he was experiencing any auditory and or visual hallucinations, to which patient endorsed he was not, and objectively, did not appear to be presenting with psychotic features, paranoia, and or ideas of reference.  Patient asked about suicidal and or homicidal ideations, to which patient endorsed he was experiencing neither, and denied any instability of his mental health that, "my doctor has not already addressed, I am good". Patient endorses that he is current on his LAI, states abruptly and curtly that, "already on it."  Patient asked to expand on substance abuse history, but despite prompting to give historical insight, patient refuses.  Patient endorsed no depressive and/or anxious symptomology, denied any problems with eating or sleeping, and denied any need for inpatient mental health hospitalization.  Patient asked if he had desires to consider addressing his substance use, given his history of methamphetamine abuse that is appreciable per chart review, and his own endorsements to some degree, but patient refused.  Patient asked a variety of additional historical questions, but abruptly and curtly declines to continue to participate any further, outside of discussing discharge.  Patient  given recommendations for psychiatric clearance, as well as additional recommendations above, to which patient very abruptly and curtly, verbalized understanding.  Review of Systems  Musculoskeletal:  Positive for back pain.  Psychiatric/Behavioral:  Positive for substance abuse (Meth). Negative for depression, hallucinations and suicidal ideas. The patient is not nervous/anxious and does not have insomnia.   All other systems reviewed and are negative.   Psychiatric and Social History  Psychiatric History:  Information collected from chart review/patient/testimony given by brother  Prev Dx/Sx: methamphetamine abuse, and recently within the last several months, past psychiatric history of schizoaffective disorder bipolar type, generalized anxiety, and PTSD Current Psych Provider: Ronny Colas, PA at [facility] Home Meds (current):  Buspirone  5 mg 2 times daily Hydroxyzine 25 mg every 6 hours as needed Risperidone 3 mg at bedtime Risperidone 100 mg / 0.28 mL every 30 days (Uzedy/long-acting injectable) Sertraline  50 mg daily Trazodone 50 mg at bedtime as needed  Previous Med Trials: Haldol , Zoloft , BuSpar  Therapy: None endorsed or reported  Prior Psych Hospitalization: Multiple, most recent [facility] 05/02/2023 - 05/10/2023 Prior Self Harm: None reported  Prior Violence: Yes, has gotten aggressive with staff this encounter due to substance induced psychosis   Family Psych History: Patient reports that a few family members have mental illness but does not know their diagnoses  Family Hx suicide:  Patient endorses a family history of substance abuse but does not know what his family members abused   Social History:  Developmental Hx: None endorsed Educational Hx: None endorsed Occupational Hx: Possibly working at [employer], but does not elaborate Legal Hx: Incarcerated from 2017-2023; treated for substance abuse while incarcerated, per family 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, Microbiologist Spiritual Hx: None endorsed Access to weapons/lethal means: None endorsed  Substance History Alcohol: Reports no recent alcohol use, per records, utilizes "occasionally" Tobacco: Someday smoker, unclear how long, patient does not answer Illicit drugs: Methamphetamine abuse, cannabis use Prescription drug abuse: None endorsed Rehab hx: None endorsed  Exam Findings  Physical Exam: As below Vital Signs:  Temp:  [98.4 F (36.9 C)-98.7 F (37.1 C)] 98.7 F (37.1 C) (05/23 0657) Pulse Rate:  [87-107] 87 (05/23 0657) Resp:  [18-21] 18 (05/23 0657) BP: (99-140)/(63-111) 106/75 (05/23 0657) SpO2:  [91 %-100 %] 99 % (05/23 0657) Weight:  [95 kg] 95 kg (05/22 2110) Blood pressure 106/75, pulse 87, temperature 98.7 F (37.1 C), temperature source Axillary, resp. rate 18, height 6' (1.829 m), weight 95 kg, SpO2 99%. Body mass index is 28.4 kg/m.  Physical Exam Vitals and nursing note reviewed.  Constitutional:      General: He is not in acute distress.    Appearance: Normal appearance. He is normal weight. He is not ill-appearing, toxic-appearing or diaphoretic.     Comments: Reserved, irritable, and guarded interpersonal style   Pulmonary:     Effort: Pulmonary effort is normal.  Skin:    General: Skin is warm and dry.  Neurological:     Mental Status: He is oriented to person, place, and time.     Motor: No tremor or seizure activity.  Psychiatric:        Attention and Perception: Perception normal. He is inattentive. He does not perceive auditory or visual hallucinations.        Speech: He is noncommunicative. Speech is delayed (intentionally reserved, curt).        Behavior: Behavior is uncooperative, agitated and withdrawn.        Thought Content: Thought content normal. Thought content is not paranoid or delusional. Thought content does not include homicidal or suicidal ideation.        Cognition and Memory: Cognition and memory normal.         Judgment: Judgment normal.     Comments: Neutral to mildly constricted affect with irritable edge; "I'm good" incongruent mood    Mental Status Exam: General Appearance: In scrubs with reserved, irritable, and guarded interpersonal style  Orientation:  Full (Time, Place, and Person)  Memory:  Within defined limits  Concentration:  Concentration: Intentionally inattentive and Attention Span: Intentionally inattentive  Recall:  Selectively answers questions curtly   Attention  Other: Intentionally inattentive  Eye Contact:  Minimal  Speech:  Vague, minimal, abrupt, curt  Language:  Fair  Volume:  Normal  Mood: "I'm good"  Affect:  Non-Congruent and Constricted, irritable edge  Thought Process:  Coherent, Goal Directed, and Linear  Thought Content:  Logical  Suicidal Thoughts:  No  Homicidal Thoughts:  No  Judgement:  Intact  Insight:  Lacking  Psychomotor Activity:  Normal  Akathisia:  No  Fund of Knowledge:  Fair      Assets:  Health and safety inspector Housing Intimacy Leisure Time Physical Health Resilience Social Support Talents/Skills Transportation Vocational/Educational  Cognition:  WNL  ADL's:  Intact  AIMS (if indicated):   0     Other History   These have been pulled in through the EMR, reviewed, and updated if appropriate.  Family History:  The patient's family history is not on file.  Medical History: Past Medical History:  Diagnosis Date   Anxiety    Concussion    Drug abuse (HCC)    Psychosis (HCC)    Shoulder dislocation    Substance abuse (HCC)  Surgical History: History reviewed. No pertinent surgical history.   Medications:   Current Facility-Administered Medications:    busPIRone  (BUSPAR ) tablet 5 mg, 5 mg, Oral, BID, Dalene Duck, MD, 5 mg at 06/14/23 0053   haloperidol  (HALDOL ) tablet 10 mg, 10 mg, Oral, BID, Dalene Duck, MD, 10 mg at 06/14/23 1610   naproxen  (NAPROSYN ) tablet 500 mg, 500 mg, Oral, BID WC, Dalene Duck, MD   sertraline  (ZOLOFT ) tablet 50 mg, 50 mg, Oral, Daily, Dalene Duck, MD, 50 mg at 06/14/23 9604  Current Outpatient Medications:    busPIRone  (BUSPAR ) 5 MG tablet, Take 1 tablet (5 mg total) by mouth 2 (two) times daily., Disp: 60 tablet, Rfl: 1   cyclobenzaprine  (FLEXERIL ) 10 MG tablet, Take 1 tablet (10 mg total) by mouth 2 (two) times daily as needed for muscle spasms., Disp: 20 tablet, Rfl: 0   haloperidol  (HALDOL ) 10 MG tablet, Take 1 tablet (10 mg total) by mouth 2 (two) times daily., Disp: 60 tablet, Rfl: 1   meloxicam  (MOBIC ) 7.5 MG tablet, Take 1 tablet (7.5 mg total) by mouth daily., Disp: 5 tablet, Rfl: 0   naproxen  (NAPROSYN ) 500 MG tablet, Take 1 tablet (500 mg total) by mouth 2 (two) times daily with a meal., Disp: 60 tablet, Rfl: 1   ondansetron  (ZOFRAN  ODT) 8 MG disintegrating tablet, Take 1 tablet (8 mg total) by mouth every 8 (eight) hours as needed for nausea or vomiting., Disp: 10 tablet, Rfl: 0   pantoprazole  (PROTONIX ) 40 MG tablet, Take 1 tablet (40 mg total) by mouth daily for 30 days., Disp: 30 tablet, Rfl: 0   sertraline  (ZOLOFT ) 50 MG tablet, Take 1 tablet (50 mg total) by mouth daily., Disp: 30 tablet, Rfl: 1  Allergies: Allergies  Allergen Reactions   Tramadol      Upset stomach    Tramadol  Other (See Comments)    "Upsets the stomach"    Volanda Gruber, NP

## 2023-06-14 NOTE — ED Provider Notes (Signed)
 Emergency Medicine Observation Re-evaluation Note  Jeff Yang is a 38 y.o. male, seen on rounds today.  Pt initially presented to the ED for complaints of Mental Health Problem Currently, the patient is resting.  Physical Exam  BP 106/75 (BP Location: Left Arm)   Pulse 87   Temp 98.7 F (37.1 C) (Axillary)   Resp 18   Ht 1.829 m (6')   Wt 95 kg   SpO2 99%   BMI 28.40 kg/m  Physical Exam General: sleeping Cardiac: regular rate Lungs: normal effort   ED Course / MDM  EKG:EKG Interpretation Date/Time:  Thursday Jun 13 2023 17:07:20 EDT Ventricular Rate:  98 PR Interval:  143 QRS Duration:  91 QT Interval:  357 QTC Calculation: 456 R Axis:   67  Text Interpretation: Sinus rhythm ST elev, probable normal early repol pattern No significant change since last tracing Confirmed by Florette Hurry [phone]) on 06/13/2023 5:19:32 PM  I have reviewed the labs performed to date as well as medications administered while in observation.  Recent changes in the last 24 hours include initial med clearance.  Plan  Current plan is for initial psychiatric assessment.    Trish Furl, MD 06/14/23 [phone]

## 2023-06-14 NOTE — Consult Note (Signed)
 IRIS TELEPSYCHIATRY  Attempted to interview patient at around 0430h EDT.  Staff worked quite diligently to try to get patient to wake up and be interviewed, and I too attempted to encourage him to interact, but he refused to turn over and continually kept falling back asleep.  Recommended that evaluation be put off until patient is alert and able to interact in evaluation.

## 2023-06-14 NOTE — ED Notes (Signed)
 Requested RN, Porfirio Bristol to put in a IVC order. IVC complete case # [id number]. Original copy put in red folder. 3 Copies made and put in purple zone on clip board  1 copy in medical records

## 2023-06-14 NOTE — ED Notes (Signed)
IVC paperwork in progress 

## 2023-06-14 NOTE — Discharge Instructions (Addendum)
 Please continue current outpatient psychiatric medications Please perform close outpatient follow-up with the patient's outpatient provider at the [facility] behavioral health outpatient center Please strictly adhere to safety plan created today Please consider close outpatient follow-up with substance abuse resources given today Please consider abstaining from illicit substances  Safety Plan Jeff Yang will reach out to Charlean MacNeil, call 911 or call mobile crisis, or go to nearest emergency room if condition worsens or if suicidal thoughts become active Patients' will follow up with [facility] for outpatient psychiatric services (therapy/medication management).  Patient will consider close outpatient follow-up with substance abuse resources given upon discharge The suicide prevention education provided includes the following: Suicide risk factors Suicide prevention and interventions National Suicide Hotline telephone number [facility] assessment telephone number [facility] Emergency Assistance 911 [facility] and/or Residential Mobile Crisis Unit telephone number

## 2023-06-14 NOTE — ED Notes (Signed)
 IVC Paperwork passed off to me, has been completed and uploaded . Envelope #[id number] has been submitted successfully.

## 2023-06-14 NOTE — ED Notes (Signed)
 New IVC Paperwork was stamped By me, but not completed or processed by me for Ayah to be completed

## 2023-06-14 NOTE — ED Notes (Signed)
 NS/MT Alysha passed IVC paperwork to me, patient's IVC paperwork needs to be redone all the way from the beginning, paperwork given to Dr. Candelaria Chaco to fill out.

## 2023-06-28 ENCOUNTER — Emergency Department (HOSPITAL_COMMUNITY)
Admission: EM | Admit: 2023-06-28 | Discharge: 2023-06-29 | Disposition: A | Discharge status: Home / Self Care | Payer: MEDICAID | Attending: Emergency Medicine | Admitting: Emergency Medicine

## 2023-06-28 DIAGNOSIS — Z765 Malingerer [conscious simulation]: Secondary | ICD-10-CM | POA: Diagnosis not present

## 2023-06-28 DIAGNOSIS — S299XXA Unspecified injury of thorax, initial encounter: Secondary | ICD-10-CM | POA: Diagnosis present

## 2023-06-28 DIAGNOSIS — F17200 Nicotine dependence, unspecified, uncomplicated: Secondary | ICD-10-CM | POA: Diagnosis not present

## 2023-06-28 DIAGNOSIS — F410 Panic disorder [episodic paroxysmal anxiety] without agoraphobia: Secondary | ICD-10-CM | POA: Diagnosis not present

## 2023-06-28 DIAGNOSIS — F1520 Other stimulant dependence, uncomplicated: Secondary | ICD-10-CM | POA: Diagnosis present

## 2023-06-28 DIAGNOSIS — Z5329 Procedure and treatment not carried out because of patient's decision for other reasons: Secondary | ICD-10-CM | POA: Diagnosis not present

## 2023-06-28 DIAGNOSIS — R462 Strange and inexplicable behavior: Secondary | ICD-10-CM

## 2023-06-28 DIAGNOSIS — Y900 Blood alcohol level of less than 20 mg/100 ml: Secondary | ICD-10-CM | POA: Diagnosis not present

## 2023-06-28 DIAGNOSIS — R0602 Shortness of breath: Secondary | ICD-10-CM | POA: Diagnosis not present

## 2023-06-28 DIAGNOSIS — X58XXXA Exposure to other specified factors, initial encounter: Secondary | ICD-10-CM | POA: Diagnosis not present

## 2023-06-28 DIAGNOSIS — F19959 Other psychoactive substance use, unspecified with psychoactive substance-induced psychotic disorder, unspecified: Secondary | ICD-10-CM | POA: Diagnosis not present

## 2023-06-28 LAB — COMPREHENSIVE METABOLIC PANEL WITH GFR
ALT: 16 U/L (ref 0–44)
AST: 23 U/L (ref 15–41)
Albumin: 3.6 g/dL (ref 3.5–5.0)
Alkaline Phosphatase: 45 U/L (ref 38–126)
Anion gap: 8 (ref 5–15)
BUN: 19 mg/dL (ref 6–20)
CO2: 22 mmol/L (ref 22–32)
Calcium: 9 mg/dL (ref 8.9–10.3)
Chloride: 108 mmol/L (ref 98–111)
Creatinine, Ser: 1.21 mg/dL (ref 0.61–1.24)
GFR, Estimated: >60 mL/min (ref >60)
Glucose, Bld: 100 mg/dL — ABNORMAL HIGH (ref 70–99)
Potassium: 4.1 mmol/L (ref 3.5–5.1)
Sodium: 138 mmol/L (ref 135–145)
Total Bilirubin: 1 mg/dL (ref 0.0–1.2)
Total Protein: 6.3 g/dL — ABNORMAL LOW (ref 6.5–8.1)

## 2023-06-28 LAB — CBC WITH DIFFERENTIAL/PLATELET
Abs Immature Granulocytes: 0.03 10*3/uL (ref 0.00–0.07)
Basophils Absolute: 0.1 10*3/uL (ref 0.0–0.1)
Basophils Relative: 1 %
Eosinophils Absolute: 0.1 10*3/uL (ref 0.0–0.5)
Eosinophils Relative: 1 %
HCT: 33.1 % — ABNORMAL LOW (ref 39.0–52.0)
Hemoglobin: 11.5 g/dL — ABNORMAL LOW (ref 13.0–17.0)
Immature Granulocytes: 0 %
Lymphocytes Relative: 16 %
Lymphs Abs: 1.3 10*3/uL (ref 0.7–4.0)
MCH: 31.4 pg (ref 26.0–34.0)
MCHC: 34.7 g/dL (ref 30.0–36.0)
MCV: 90.4 fL (ref 80.0–100.0)
Monocytes Absolute: 0.8 10*3/uL (ref 0.1–1.0)
Monocytes Relative: 10 %
Neutro Abs: 5.6 10*3/uL (ref 1.7–7.7)
Neutrophils Relative %: 72 %
Platelets: 401 10*3/uL — ABNORMAL HIGH (ref 150–400)
RBC: 3.66 MIL/uL — ABNORMAL LOW (ref 4.22–5.81)
RDW: 11.9 % (ref 11.5–15.5)
WBC: 7.8 10*3/uL (ref 4.0–10.5)
nRBC: 0 % (ref 0.0–0.2)

## 2023-06-28 LAB — ETHANOL: Alcohol, Ethyl (B): <15 mg/dL (ref <15)

## 2023-06-28 LAB — SALICYLATE LEVEL: Salicylate Lvl: <7 mg/dL — ABNORMAL LOW (ref 7.0–30.0)

## 2023-06-28 LAB — ACETAMINOPHEN LEVEL: Acetaminophen (Tylenol), Serum: <10 ug/mL — ABNORMAL LOW (ref 10–30)

## 2023-06-28 MED ORDER — SODIUM CHLORIDE 0.9 % IV BOLUS
1000.0000 mL | Freq: Once | INTRAVENOUS | Status: AC
  Administered 2023-06-28: 1000 mL via INTRAVENOUS

## 2023-06-28 NOTE — ED Notes (Signed)
 TTS in process

## 2023-06-28 NOTE — BH Assessment (Signed)
 06/06 @ 2004:TTS attempted to complete assessment. Patient was was refusing to use his words, he was mumbling unintelligible speech, pointing at things, RN in the room attempted to interpret what the patient was trying to say but it was unclear to the clinician. Due to patients presentation TTS was unable to complete the assessment and patient will be seen in the morning by the psychiatric provider to better determine the next course of action.

## 2023-06-28 NOTE — ED Provider Notes (Signed)
 [facility] EMERGENCY DEPARTMENT AT  HOSPITAL Provider Note  CSN: [id number] Arrival date & time: 06/28/23 1704  Chief Complaint(s) Altered Mental Status  HPI Jeff Yang is a 38 y.o. male history of polysubstance abuse, psychosis, schizoaffective disorder presenting to the emergency department with altered mental status.  Patient was at [employer] today, while at [place], he was yelling at employees and customers, dancing, singing, stole a candy bar, and then locked himself in his car.  When police tried to get him out of the car, he just repeated to the police "you are the police, investigate".  Police had to drag him out of the car, did cut his arm on the window glass.  Paramedics were called, patient was not cooperative, did have IV placed and received fluids.  EMS talked to his brother who reports he has been using lots of methamphetamine.Aaron Aas  History limited as patient not speaking.   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 Authorizing Provider  busPIRone  (BUSPAR ) 5 MG tablet Take 1 tablet (5 mg total) by mouth 2 (two) times daily. 04/02/23   Nwoko, Uchenna E, PA  cyclobenzaprine  (FLEXERIL ) 10 MG tablet Take 1 tablet (10 mg total) by mouth 2 (two) times daily as needed  for muscle spasms. 06/29/17   Khatri, Hina, PA-C  haloperidol  (HALDOL ) 10 MG tablet Take 1 tablet (10 mg total) by mouth 2 (two) times daily. 04/02/23   Nwoko, Uchenna E, PA  meloxicam  (MOBIC ) 7.5 MG tablet Take 1 tablet (7.5 mg total) by mouth daily. 11/22/22   Palumbo, April, MD  naproxen  (NAPROSYN ) 500 MG tablet Take 1 tablet (500 mg total) by mouth 2 (two) times daily with a meal. 04/02/23   Nwoko, Uchenna E, PA  ondansetron  (ZOFRAN  ODT) 8 MG disintegrating tablet Take 1 tablet (8 mg total) by mouth every 8 (eight) hours as needed for nausea or vomiting. 09/28/14   Molpus, John, MD  pantoprazole  (PROTONIX ) 40 MG tablet Take 1 tablet (40 mg total) by mouth daily for 30 days. 03/14/18 04/13/18  Long, Shereen Dike, MD  sertraline  (ZOLOFT ) 50 MG tablet Take 1 tablet (50 mg total) by mouth daily. 04/02/23   Nwoko, Uchenna E, PA  Past Surgical History No past surgical history on file. Family History No family history on file.  Social History Social History   Tobacco Use   Smoking status: Some Days   Smokeless tobacco: Never  Substance Use Topics   Alcohol use: Yes    Comment: occ   Drug use: Yes    Types: Methamphetamines   Allergies Tramadol  and Tramadol   Review of Systems Review of Systems  Unable to perform ROS: Patient nonverbal    Physical Exam Vital Signs  I have reviewed the triage vital signs BP 129/78   Temp 99.3 F (37.4 C) (Rectal)   Resp 13   Ht 6' (1.829 m)   Wt 95 kg   SpO2 100%   BMI 28.40 kg/m  Physical Exam Vitals and nursing note reviewed.  Constitutional:      Comments: Keeps eyes closed, resists eye opening, moves all 4 extremities  HENT:     Mouth/Throat:     Mouth: Mucous membranes are moist.  Eyes:     Conjunctiva/sclera: Conjunctivae normal.  Cardiovascular:     Rate and Rhythm: Regular rhythm. Tachycardia present.   Pulmonary:     Effort: Pulmonary effort is normal. No respiratory distress.     Breath sounds: Normal breath sounds.  Abdominal:     General: Abdomen is flat.     Palpations: Abdomen is soft.     Tenderness: There is no abdominal tenderness.  Musculoskeletal:     Right lower leg: No edema.     Left lower leg: No edema.  Skin:    General: Skin is warm and dry.     Capillary Refill: Capillary refill takes less than 2 seconds.     Comments: Superficial laceration to the lateral left upper arm  Neurological:     Comments: Moves all four extremities, forces eyes shut, no obvious cranial nerve deficit, limited by compliance  Psychiatric:     Comments: Unable to assess     ED Results and Treatments Labs (all labs ordered are listed, but only abnormal results are displayed) Labs Reviewed  COMPREHENSIVE METABOLIC PANEL WITH GFR - Abnormal; Notable for the following components:      Result Value   Glucose, Bld 100 (*)    Total Protein 6.3 (*)    All other components within normal limits  CBC WITH DIFFERENTIAL/PLATELET - Abnormal; Notable for the following components:   RBC 3.66 (*)    Hemoglobin 11.5 (*)    HCT 33.1 (*)    Platelets 401 (*)    All other components within normal limits  SALICYLATE LEVEL - Abnormal; Notable for the following components:   Salicylate Lvl <7.0 (*)    All other components within normal limits  ACETAMINOPHEN  LEVEL - Abnormal; Notable for the following components:   Acetaminophen  (Tylenol ), Serum <10 (*)    All other components within normal limits  ETHANOL  URINALYSIS, W/ REFLEX TO CULTURE (INFECTION SUSPECTED)  RAPID URINE DRUG SCREEN, HOSP PERFORMED  Radiology No results found.  Pertinent labs & imaging results that were available during my care of the patient were reviewed by me and considered in my medical decision making  (see MDM for details).  Medications Ordered in ED Medications  sodium chloride  0.9 % bolus 1,000 mL (1,000 mLs Intravenous New Bag/Given 06/28/23 1750)                                                                                                                                     Procedures Procedures  (including critical care time)  Medical Decision Making / ED Course   MDM:  38 year old presenting with altered mental status.  Suspect likely due to underlying psychiatric illness and polysubstance abuse.  Patient was awake and agitated that she eats.  After police were involved the patient was refusing to speak.  The police have left for now patient is more awake and interactive.  Will place on IVC given behavioral change.  Labs overall reassuring.  Considered neuroimaging but do not think this is necessary at the moment.  Will consult psychiatric    Clinical Course as of 06/28/23 1953  Timm Foot Jun 28, 2023  1914 As soon as Emergency planning/management officer left, patient became much more awake.  Suspect he was acting like he was asleep for secondary gain.  However he was acting very erratically today, has history of psychosis, will place on IVC, he will need psych consult.  Do not think he needs any head imaging.  Has had recent head CT scan which is negative [WS]    Clinical Course User Index [WS] Mordecai Applebaum, MD     Additional history obtained: -Additional history obtained from ems -External records from outside source obtained and reviewed including: Chart review including previous notes, labs, imaging, consultation notes including prior notes    Lab Tests: -I ordered, reviewed, and interpreted labs.   The pertinent results include:   Labs Reviewed  COMPREHENSIVE METABOLIC PANEL WITH GFR - Abnormal; Notable for the following components:      Result Value   Glucose, Bld 100 (*)    Total Protein 6.3 (*)    All other components within normal limits  CBC WITH DIFFERENTIAL/PLATELET -  Abnormal; Notable for the following components:   RBC 3.66 (*)    Hemoglobin 11.5 (*)    HCT 33.1 (*)    Platelets 401 (*)    All other components within normal limits  SALICYLATE LEVEL - Abnormal; Notable for the following components:   Salicylate Lvl <7.0 (*)    All other components within normal limits  ACETAMINOPHEN  LEVEL - Abnormal; Notable for the following components:   Acetaminophen  (Tylenol ), Serum <10 (*)    All other components within normal limits  ETHANOL  URINALYSIS, W/ REFLEX TO CULTURE (INFECTION SUSPECTED)  RAPID URINE DRUG SCREEN, HOSP PERFORMED    Notable for mild anemia, normal tylenol  and salicylate levels   EKG  EKG Interpretation Date/Time:  Friday June 28 2023 17:22:34 EDT Ventricular Rate:  110 PR Interval:  141 QRS Duration:  76 QT Interval:  328 QTC Calculation: 444 R Axis:   70  Text Interpretation: Sinus tachycardia Confirmed by Hiawatha Lout ([id number]) on 06/28/2023 5:31:30 PM         Medicines ordered and prescription drug management: Meds ordered this encounter  Medications   sodium chloride  0.9 % bolus 1,000 mL    -I have reviewed the patients home medicines and have made adjustments as needed   Social Determinants of Health:  Diagnosis or treatment significantly limited by social determinants of health: polysubstance abuse   Reevaluation: After the interventions noted above, I reevaluated the patient and found that their symptoms have improved  Co morbidities that complicate the patient evaluation  Past Medical History:  Diagnosis Date   Anxiety    Concussion    Drug abuse (HCC)    Psychosis (HCC)    Shoulder dislocation    Substance abuse (HCC)       Dispostion: Disposition decision including need for hospitalization was considered, and patient boarding for psychiatric evaluation    Final Clinical Impression(s) / ED Diagnoses Final diagnoses:  Bizarre behavior     This chart was dictated using voice  recognition software.  Despite best efforts to proofread,  errors can occur which can change the documentation meaning.    Mordecai Applebaum, MD 06/28/23 [phone]

## 2023-06-28 NOTE — ED Notes (Signed)
 Pt is not using words with staff; speaking to staff through closed mouth; Pt unable to complete full TTS due to not using his words; pt is a&ox 4 and ambulated to the bathroom w/o assistance; Pt refused to give urine sample; Pt given dinner tray after TTS attempt-Monique,RN

## 2023-06-28 NOTE — ED Notes (Signed)
Patient wanded by security-Monique,RN

## 2023-06-28 NOTE — ED Notes (Signed)
 Pt forcing his eyes closed when nurses attempted to look at pupils. Pt began crying during rectal temp check. He was asked multiple times to give a urine sample with no reply. Still refusing to open eyes or communicate at all. During two nurse attempt to insert an in and out foley pt became combative and uncooperative. Pt opened his eyes but refused to communicate with staff. In and out foley was unsuccessful and was removed. Pt again was asked to urinate in a urinal with no response. Urinal placed between pt's leg and pt was covered with a gown. MD notified.

## 2023-06-28 NOTE — ED Triage Notes (Signed)
 Pt BIB ems from a parking lot. [employer] called pt [employer] employees for pt yelling, dancing, singing at bystanders and theft. [employer] called ems for AMS. Pt has a history of crystal meth use. Pt's brother reported pt has been on a "meth bender."

## 2023-06-28 NOTE — ED Notes (Signed)
 Belongings inventoried and placed in locked 6.

## 2023-06-29 ENCOUNTER — Other Ambulatory Visit: Payer: Self-pay

## 2023-06-29 ENCOUNTER — Encounter (HOSPITAL_COMMUNITY): Payer: Self-pay | Admitting: Psychiatry

## 2023-06-29 ENCOUNTER — Encounter (HOSPITAL_COMMUNITY): Payer: Self-pay

## 2023-06-29 ENCOUNTER — Emergency Department (HOSPITAL_COMMUNITY)
Admission: EM | Admit: 2023-06-29 | Discharge: 2023-06-30 | Discharge status: Against Medical Advice | Payer: MEDICAID | Source: Home / Self Care | Attending: Emergency Medicine | Admitting: Emergency Medicine

## 2023-06-29 ENCOUNTER — Emergency Department (HOSPITAL_COMMUNITY): Payer: MEDICAID

## 2023-06-29 DIAGNOSIS — S299XXA Unspecified injury of thorax, initial encounter: Secondary | ICD-10-CM | POA: Insufficient documentation

## 2023-06-29 DIAGNOSIS — F410 Panic disorder [episodic paroxysmal anxiety] without agoraphobia: Secondary | ICD-10-CM | POA: Insufficient documentation

## 2023-06-29 DIAGNOSIS — F19959 Other psychoactive substance use, unspecified with psychoactive substance-induced psychotic disorder, unspecified: Secondary | ICD-10-CM | POA: Diagnosis not present

## 2023-06-29 DIAGNOSIS — X58XXXA Exposure to other specified factors, initial encounter: Secondary | ICD-10-CM | POA: Insufficient documentation

## 2023-06-29 DIAGNOSIS — Y900 Blood alcohol level of less than 20 mg/100 ml: Secondary | ICD-10-CM | POA: Insufficient documentation

## 2023-06-29 DIAGNOSIS — F1520 Other stimulant dependence, uncomplicated: Secondary | ICD-10-CM | POA: Diagnosis not present

## 2023-06-29 DIAGNOSIS — Z765 Malingerer [conscious simulation]: Secondary | ICD-10-CM | POA: Insufficient documentation

## 2023-06-29 DIAGNOSIS — Z5329 Procedure and treatment not carried out because of patient's decision for other reasons: Secondary | ICD-10-CM | POA: Insufficient documentation

## 2023-06-29 DIAGNOSIS — R0602 Shortness of breath: Secondary | ICD-10-CM | POA: Insufficient documentation

## 2023-06-29 LAB — CBC
HCT: 34.3 % — ABNORMAL LOW (ref 39.0–52.0)
Hemoglobin: 11.6 g/dL — ABNORMAL LOW (ref 13.0–17.0)
MCH: 31 pg (ref 26.0–34.0)
MCHC: 33.8 g/dL (ref 30.0–36.0)
MCV: 91.7 fL (ref 80.0–100.0)
Platelets: 388 10*3/uL (ref 150–400)
RBC: 3.74 MIL/uL — ABNORMAL LOW (ref 4.22–5.81)
RDW: 12 % (ref 11.5–15.5)
WBC: 7.1 10*3/uL (ref 4.0–10.5)
nRBC: 0 % (ref 0.0–0.2)

## 2023-06-29 LAB — BASIC METABOLIC PANEL WITH GFR
Anion gap: 8 (ref 5–15)
BUN: 18 mg/dL (ref 6–20)
CO2: 27 mmol/L (ref 22–32)
Calcium: 9 mg/dL (ref 8.9–10.3)
Chloride: 100 mmol/L (ref 98–111)
Creatinine, Ser: 0.91 mg/dL (ref 0.61–1.24)
GFR, Estimated: >60 mL/min (ref >60)
Glucose, Bld: 117 mg/dL — ABNORMAL HIGH (ref 70–99)
Potassium: 3.3 mmol/L — ABNORMAL LOW (ref 3.5–5.1)
Sodium: 135 mmol/L (ref 135–145)

## 2023-06-29 LAB — SALICYLATE LEVEL: Salicylate Lvl: <7 mg/dL — ABNORMAL LOW (ref 7.0–30.0)

## 2023-06-29 LAB — ETHANOL: Alcohol, Ethyl (B): <15 mg/dL (ref <15)

## 2023-06-29 LAB — ACETAMINOPHEN LEVEL: Acetaminophen (Tylenol), Serum: <10 ug/mL — ABNORMAL LOW (ref 10–30)

## 2023-06-29 MED ORDER — IBUPROFEN 400 MG PO TABS
400.0000 mg | ORAL_TABLET | Freq: Four times a day (QID) | ORAL | Status: DC | PRN
  Filled 2023-06-29: qty 1

## 2023-06-29 MED ORDER — ACETAMINOPHEN 325 MG PO TABS
650.0000 mg | ORAL_TABLET | Freq: Four times a day (QID) | ORAL | Status: DC | PRN
  Filled 2023-06-29: qty 2

## 2023-06-29 NOTE — ED Triage Notes (Addendum)
 EMS reports: Patient is here from work. Reports troubled breathing during his panic attack. EMS states he has broken ribs on the right side and his breathing has gotten worse over the last few days. EMS states the patient went to urgent care recently. Patient has not been able to get his anxiety medicine, because it is locked in his car and his car was towed.   Patient states he was seen at the urgent care last week and they wold him to tome to the ER if he continues to have the same problems.

## 2023-06-29 NOTE — ED Notes (Signed)
 Patient is talking to himself. Patient states have you ever swallowed oxygen. Patient also states he does not want to get a chest xray because he can't stop drinking water. However the patient is not drinking any water at this time.

## 2023-06-29 NOTE — ED Notes (Signed)
 Patient comes to desk once again speaking w/o opening his mouth; Staff was unable to tell what patient was requesting; pt went room and rolled on the floor for a minute and slammed the door; RN advised patient that door must be kept open for safety; pt clutching his stomach and when staff ask questions pt does not speak; pt pulled code button; Consulting civil engineer and other staff in unit for support; pt continues to refutes to speak or state how we can help; Pt sat on floor until he finally placed himself back in bed; Pt was breathing normal and did not appear to be in any acute distress-Monique,RN

## 2023-06-29 NOTE — BH Assessment (Signed)
 Clinician checked in with Monique, RN if the pt is awake and able to engage in TTS assessment.   Per RN, pt is not talking and currently sleeping. Clinician asked RN to let her know when the pt is awake and she will assess him.    Rosi Converse, MS, [facility], [facility] Triage Specialist [phone]

## 2023-06-29 NOTE — Discharge Instructions (Addendum)
 Please follow up with [facility] Please avoid using methamphetamines or any other street drugs that can trigger your mental health issues Please strictly adhere to safety plan created today Please consider close outpatient follow-up with substance abuse resources given upon discharge Please consider continuing current outpatient psychiatric medication regimen  Safety Plan Jeff Yang will reach out to Brother/Girlfriend, call 911 or call mobile crisis, or go to nearest emergency room if condition worsens or if suicidal thoughts become active Patients' will follow up with [facility] for outpatient psychiatric services (therapy/medication management).  Patient will consider close outpatient follow-up with substance abuse resources given upon discharge. The suicide prevention education provided includes the following: Suicide risk factors Suicide prevention and interventions National Suicide Hotline telephone number [facility] assessment telephone number [facility] Emergency Assistance 911 [facility] and/or Residential Mobile Crisis Unit telephone number Request made of family/significant other to:  Brother Remove weapons (e.g., guns, rifles, knives), all items previously/currently identified as safety concern.   Remove drugs/medications (over the counter, prescriptions, illicit drugs), all items previously/currently identified as a safety concern.

## 2023-06-29 NOTE — Progress Notes (Deleted)
 [facility] LCSW Progress Note   06/29/2023    10:32 AM  Jeff Yang   [id number]   Type of Contact and Topic:  Psychiatric Bed Placement   Pt accepted to [name]    Patient meets inpatient criteria per Heyward Loud, NP  The attending provider will be Dr. Kieran Pellet  Call report to [phone]  [name], RN @ [facility] ED notified.     Pt scheduled  to arrive at [facility] (6/9) after 0900.    [name], MSW, LCSW-A  10:34 AM 06/29/2023

## 2023-06-29 NOTE — Progress Notes (Deleted)
 Patient has been denied by [facility] due to no appropriate beds available. Patient meets [facility] inpatient criteria per Heyward Loud, NP. Patient has been faxed out to the following facilities:   [facility] [address], [place] [address] [phone]  [facility] [address] [place] [address] [phone]  [facility] [address] [place], [place] [address] [phone]  [facility] Patient Placement [facility], [place] [phone]  [facility] [address] Johnella Naas [place] [address] [phone]  [facility] [address], [place] [address] [phone]  [facility] EFAX [address], [place] [phone]  [facility] [address], [place] [address] [phone]  [facility] [address] [place] [address] [phone]  [facility] [address] [place] [address] [phone]  [facility] Josephina Nicks [place] [address] [phone]  [facility] [place] [address] [phone]  [facility] [address] [place], [place] [address] [phone]  [facility] [address] [place] [address] [phone]  [facility] [address], [place] [address] [phone]  [facility] [address] [place] [address] [phone]  [facility] [address] [place] [address] [phone]   Phares Brasher, MSW, LCSW-A   10:15 AM 06/29/2023

## 2023-06-29 NOTE — ED Provider Notes (Signed)
 Patient was reevaluated by behavioral health team that was able to perform a more extensive background investigation, high concern for malingering for secondary gain, also suspicious of amphetamine use disorder.  The patient has been calm and cooperative at least recently with staff in the ED, although intermittently refusing to talk to staff.  IVC has been rescinded by myself; forms filed with unit secretary now  Patient is stable for outpatient follow up   Arvilla Birmingham, MD 06/29/23 1210

## 2023-06-29 NOTE — Consult Note (Cosign Needed Addendum)
 [facility] Psychiatric Consult Initial  Patient Name: .Jeff Yang  MRN: [id number]  DOB: 04-26-1985  Consult Order details:  Orders (From admission, onward)     Start     Ordered   06/28/23 1857  CONSULT TO CALL ACT TEAM       Ordering Provider: Mordecai Applebaum, MD  Provider:  (Not yet assigned)  Question:  Reason for Consult?  Answer:  Psych consult   06/28/23 1856             Mode of Visit: In person    Psychiatry Consult Evaluation  Service Date: June 29, 2023 LOS:  LOS: 0 days  Chief Complaint: "I'm good"   Primary Psychiatric Diagnoses  Psychoactive substance-induced psychosis (HCC) 2.  Amphetamine use disorder, severe (HCC)  Assessment   Jeff Yang is a 38 y.o. AA male with a past psychiatric history of methamphetamine abuse, and recently within the last several months, per extensive records review, past psychiatric history of schizoaffective disorder bipolar type, GAD, and PTSD, with pertinent medical comorbidities/history that include chronic pain, who presented this encounter by way of EMS with [employer] escort under involuntary commitment, due to being found at a local gas station yelling, dancing, and singing at bystanders, as well as committing theft within the store, who when [employer] arrived, barricaded himself in his car, requiring police to drag him out of his car, so that he could be brought in for evaluation of altered mental status.  Patient currently remains under involuntary commitment at this time, but is medically clear, per EDP team.  Patient is well-known to this writer, appreciably saw the patient 06/14/2023, and investigation revealed psychoactive substance induced psychosis in the context of methamphetamine abuse.  Upon evaluation, patient presents with symptomology this event that is most consistent with psychoactive substance induced psychosis and amphetamine use disorder severe.  Evidence is appreciable from endorsements given by the patient, where  he endorses during evaluation that prior to onset of inappropriate/psychotic behavior in the community, patient utilized methamphetamines, and now no longer presents with symptomology consistent with intoxication, psychosis, and/or concerns for safety.  Given investigation conducted, which demonstrates that the patient is no longer intoxicated or appearing under the influence of a psychoactive substance, and/or experiencing psychotic features, and gives no endorsements of suicidal and or homicidal ideations, depressive, anxious, or manic concerns, auditory and or visual hallucinations, and/or concerns for safety, recommendation is for psychiatric clearance, as well as additional recommendations listed below.  Patient given extensive education on recommendations to closely follow-up with his outpatient provider at the [facility], encouraged to strictly adhere to safety plan created today, consider taking his prescribed outpatient psychiatric medications, as they can likely be of benefit for the patient, regardless of having a primary psychotic disorder and/or illicit substance induced psychosis, and consider addressing his substance abuse with resources given upon discharge, and/or with his outpatient provider he sees for mental health.  Of additional note, patient endorses a history of schizophrenia and schizoaffective disorder bipolar type, but upon extensive records review, as well as investigation conducted today, and evaluation conducted on 06/14/2023, there is no appreciable evidence the patient has an underlying primary psychotic disorder.  From evidence however that is available, there does appear to be some underlying desire or form of secondary gain to endorsements of having a primary psychotic disorder, given the multiple incongruincies being found during serial encounters/evaluations, though it is unclear at this time what that gain is.  02/05/2023- [facility], states that  he was diagnosed with schizophrenia in prison and put on medications, no mention of [facility], no ability to produce records; inconsistent with chart review. 02/19/2023-patient reports being in prison from 2017-2023, as well as participating in long stay at [facility] hospital, but upon extensive chart review, there is no evidence of this being the case, as well as brothers testimony given today, endorses that the patient was not in prison,  and/or incarcerated nonetheless in jail, for that period of time. 03/15/2023-presented to [facility] for anxiety state, no endorsements of drug use, which is incongruent to chart review, and no mention of primary psychotic disorder. 04/02/2023-presents to outpatient mental health office at [facility], endorses inpatient mental health hospitalization at [facility], as well as being placed on medications, but there is no evidence of this per chart review. 04/09-10/2023-seen at [facility] for delirium and altered mental status in the context of illicit substance use 06/14/2023-seen at [facility] by this provider for methamphetamine induced psychosis; brothers endorsements upon collateral give evidence to extensive drug history, versus having a primary psychotic disorder 06/29/2023- seen at [facility] by this provider for methamphetamine induced psychosis; brothers endorsements upon collateral give evidence to extensive drug history, versus having a primary psychotic disorder  Diagnoses:  Active Hospital problems: Principal Problem:   Psychoactive substance-induced psychosis (HCC) Active Problems:   Amphetamine use disorder, severe (HCC)    Plan   #Psychoactive substance-induced psychosis (HCC) #Amphetamine use disorder, severe (HCC)  ## Psychiatric Medication Recommendations:   - Recommend patient consider continuing current outpatient psychiatric medications - Recommend close outpatient follow-up with the  patient's outpatient provider at the [facility] - Recommend strict adherence to safety plan created today - Recommend patient consider close outpatient follow-up with substance abuse resources given today - Recommend patient consider abstinence from illicit substances  ## Medical Decision Making Capacity: Not specifically addressed in this encounter  ## Further Work-up: UDS (refuses)  ## Disposition:-- There are no psychiatric contraindications to discharge at this time  ## Behavioral / Environmental: -Routine agitation/safety precautions until discharge; strict adherence to safety plan upon discharge  Safety Plan Jeff Yang will reach out to Brother/Girlfriend, call 911 or call mobile crisis, or go to nearest emergency room if condition worsens or if suicidal thoughts become active Patients' will follow up with [facility] for outpatient psychiatric services (therapy/medication management).  Patient will consider close outpatient follow-up with substance abuse resources given upon discharge. The suicide prevention education provided includes the following: Suicide risk factors Suicide prevention and interventions National Suicide Hotline telephone number [facility] assessment telephone number [facility] Emergency Assistance 911 [facility] and/or Residential Mobile Crisis Unit telephone number Request made of family/significant other to:  Brother Remove weapons (e.g., guns, rifles, knives), all items previously/currently identified as safety concern.   Remove drugs/medications (over the counter, prescriptions, illicit drugs), all items previously/currently identified as a safety concern.    ## Safety and Observation Level:  - Based on my clinical evaluation, I estimate the patient to be at low risk of self harm in the current setting and upon recommendation for discharge. - At this time, we recommend  routine. This decision is based on my  review of the chart including patient's history and current presentation, interview of the patient, mental status examination, and consideration of suicide risk including evaluating suicidal ideation, plan, intent, suicidal or self-harm behaviors, risk factors, and protective factors. This judgment is based on our  ability to directly address suicide risk, implement suicide prevention strategies, and develop a safety plan while the patient is in the clinical setting. Please contact our team if there is a concern that risk level has changed.  CSSR Risk Category:   Suicide Risk Assessment: Patient has following modifiable risk factors for suicide: medication noncompliance, which we are addressing by treatment recommendations. Patient has following non-modifiable or demographic risk factors for suicide: male gender and psychiatric hospitalization Patient has the following protective factors against suicide: Access to outpatient mental health care, Supportive family, Supportive friends, Frustration tolerance, no history of suicide attempts, and no history of NSSIB  Thank you for this consult request. Recommendations have been communicated to the primary team.  We will sign off at this time.   Volanda Gruber, NP    History of Present Illness   Jeff Yang is a 38 y.o. AA male with a past psychiatric history of methamphetamine abuse, and recently within the last several months, per extensive records review, past psychiatric history of schizoaffective disorder bipolar type, GAD, and PTSD, with pertinent medical comorbidities/history that include chronic pain, who presented this encounter by way of EMS with [employer] escort under involuntary commitment, due to being found at a local gas station yelling, dancing, and singing at bystanders, as well as committing theft within the store, who when [employer] arrived, barricaded himself in his car, requiring police to drag him out of his car, so that he could be brought in  for evaluation of altered mental status.  Patient currently remains under involuntary commitment at this time, but is medically clear, per EDP team.  Patient is well-known to this writer, appreciably saw the patient 06/14/2023, and investigation revealed psychoactive substance induced psychosis in the context of methamphetamine abuse.  Patient seen today at the [facility] emergency department for face-to-face psychiatric evaluation.  Upon evaluation, like previous engagement with this provider notably on 06/14/2023, patient endorses that prior to the recent events that transpired, had used methamphetamines, though when asked to quantify, abruptly and curtly, states that he is not sure, and furthermore, when asked about the recent events that transpired, states that he is not able to give an account, and states that he is, "I'm good".  Patient asked to expand on illicit substance use, both history and current use, and/or if he had been using any EtOH, to which patient abruptly and curtly states that he has not been, and notably again this encounter when asked why he has not provided a UDS, gives no clear articulation as to his reason for refusal; BAL appreciably unremarkable.  Patient asked about tobacco use, but also does not give any insight into this.  Patient endorses his mood formally as, "I'm good", with a largely constricted affect and irritable edge, irritable and reserved interpersonal style, minimal eye contact, and short, superficial, and intentionally vague thought process.  Patient orientation is intact, no concerns for fluctuations in consciousness.  Patient endorses no depressive, manic, and/or anxious symptomology.  Patient endorses no auditory and or visual hallucinations, paranoid ideations, and/or delusional themes, and objectively, does not appear to be presenting with psychotic features.  Patient endorses no suicidal and or homicidal ideations, no suicide attempts, and/or history of  self-injurious behavior.  Patient asked about outpatient service utilization at the [facility], and if he is still taking his current medications, but outside of stating vaguely that he still goes to the outpatient office, and that he is not taking his medications for "  schizophrenia", does not further elaborate, despite lots of prompting.  Patient asked again if he would expand on his endorsements of having "schizophrenia", but upon questioning, his history is incongruent to chart review, and very vague, and when this is pointed out during evaluation, declines to further discuss the topic.  Patient prompted and asked a variety of additional historical questions, but as evaluation evolved, eventually refused to discuss anything outside of discharge.  Advised patient that given evaluation conducted, it is strongly recommended that the patient consider addressing his substance abuse, and consider restarting his psychiatric medications, but upon tense but short conversation around this topic, like previous engagement together last encounter, patient verbalizes largely he is not interested in addressing his substance abuse.  Collateral, patient's brother, Mr. Ayaansh Smail, [phone]  Call placed and extensive conversation held with the patient's brother.  Per patient's brother, states that the patient has been on a, "meth bender" over the last several days, and states that, "when he does this, he will stay in his car and not reach out to his girlfriend, who he normally lives with".  Patient's brother reports that outside of when the patient is "using" and going on a "meth bender", patient presents "pretty much normal", and states further that, "it is just the drugs, he is got to get it together".  Patient's brother expands on the patient's history of legal problems, states that his brother was not incarcerated in prison from 2017-2023, states that he has been to jail though for  a small period of time about a year and a half ago, in the context of getting caught up on methamphetamine drug charges.  Discussed with the patient's brother that given the patient is no longer intoxicated, presenting with concerns for psychosis, and does not want help with addressing his substance abuse issue he clearly has, recommendations were given for the patient for safe discharge, to which patient's brother verbalized he was sad to hear this, but upon education on how things work, as it relates to being held under involuntary commitment, patient's brother verbalized that he understood.  Review of Systems  Psychiatric/Behavioral:  Positive for substance abuse (Meth). Negative for depression, hallucinations and suicidal ideas. The patient is not nervous/anxious and does not have insomnia.   All other systems reviewed and are negative.    Psychiatric and Social History  Psychiatric History:   Information collected from chart review/patient/testimony given by brother   Prev Dx/Sx: methamphetamine abuse, and recently within the last several months, past psychiatric history of schizoaffective disorder bipolar type, generalized anxiety, and PTSD Current Psych Provider: Ronny Colas, PA at [facility] Home Meds (current): Reported per chart, but also reports not taking to date   Buspirone  5 mg 2 times daily Hydroxyzine 25 mg every 6 hours as needed Risperidone 3 mg at bedtime Risperidone 100 mg / 0.28 mL every 30 days (Uzedy/long-acting injectable) Sertraline  50 mg daily Trazodone 50 mg at bedtime as needed  Previous Med Trials: Haldol , Zoloft , BuSpar  Therapy: None endorsed or reported   Prior Psych Hospitalization: Multiple, most recent [facility] - 05/10/2023 Prior Self Harm: None reported  Prior Violence: Yes, has gotten aggressive with staff this encounter due to substance induced psychosis    Family Psych History: Patient reports that a few family members have mental illness but  does not know their diagnoses  Family Hx suicide: Patient endorses a family history of substance abuse but does not know what his family members abused    Social History:  Developmental  Hx: None endorsed Educational Hx: None endorsed Occupational Hx: Possibly working at [employer], but does not elaborate Legal Hx: Incarcerated from 2017-2023 (incongruent to chart evidence); treated for substance abuse while incarcerated, per family Living Situation: 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, Microbiologist, unable to be contacted Spiritual Hx: None endorsed  Access to weapons/lethal means: None endorsed   Substance History Alcohol: Reports no recent alcohol use, per records, utilizes "occasionally" Tobacco: Someday smoker, unclear how long, patient does not answer Illicit drugs: Methamphetamine abuse, cannabis use Prescription drug abuse: None endorsed Rehab hx: None endorsed  Exam Findings  Physical Exam: As below  Vital Signs:  Temp:  [99.1 F (37.3 C)-99.3 F (37.4 C)] 99.1 F (37.3 C) (06/06 2200) Pulse Rate:  [117] 117 (06/06 2200) Resp:  [13-20] 20 (06/06 2200) BP: (129-131)/(78-96) 131/96 (06/06 2200) SpO2:  [97 %-100 %] 100 % (06/06 2200) Weight:  [95 kg] 95 kg (06/06 1741) Blood pressure (!) 131/96, pulse (!) 117, temperature 99.1 F (37.3 C), temperature source Oral, resp. rate 20, height 6' (1.829 m), weight 95 kg, SpO2 100%. Body mass index is 28.4 kg/m.  Physical Exam Vitals and nursing note reviewed.  Constitutional:      General: He is not in acute distress.    Appearance: Normal appearance. He is normal weight. He is not ill-appearing, toxic-appearing or diaphoretic.     Comments: Irritable and reserved interpersonal style   Pulmonary:     Effort: Pulmonary effort is normal.  Skin:    General: Skin is warm and dry.  Neurological:     Mental Status: He is alert and oriented to person, place, and time.     Motor: No weakness, tremor or seizure activity.   Psychiatric:        Attention and Perception: Perception normal. He is inattentive (Intentionally). He does not perceive auditory or visual hallucinations.        Mood and Affect: Mood normal.        Speech: He is noncommunicative (Intentionally selective). Speech is delayed (frequently abrupt and curt).        Behavior: Behavior is uncooperative and agitated.        Thought Content: Thought content normal. Thought content is not paranoid or delusional. Thought content does not include homicidal or suicidal ideation.        Cognition and Memory: Cognition and memory normal.     Comments: Incongruent affect     Mental Status Exam: General Appearance: In scrubs with reserved, irritable, guarded interpersonal style  Orientation:  Oriented  Memory: Grossly intact  Concentration:  Concentration: Intentionally inattentive and Attention Span: Intentionally inattentive  Recall:  Selectively answers questions curtly  Attention  Other: Intentionally inattentive  Eye Contact:  minimal     Speech:  Vague, minimal, abrupt, curt   Language:  Fair  Volume:  Normal  Mood: "I'm good"   Affect:  Non-Congruent and Constricted with irritable edge  Thought Process:  Coherent and Goal Directed, superficial  Thought Content:  Logical  Suicidal Thoughts:  No  Homicidal Thoughts:  No  Judgement:  Intact  Insight:  Lacking  Psychomotor Activity:  Normal  Akathisia:  No  Fund of Knowledge:  Fair      Assets:  Solicitor Intimacy Leisure Time Physical Health Resilience Social Support Talents/Skills Transportation Vocational/Educational  Cognition:  WNL  ADL's:  Intact  AIMS (if indicated):   0     Other History   These have been pulled in  through the EMR, reviewed, and updated if appropriate.  Family History:  The patient's family history is not on file.  Medical History: Past Medical History:  Diagnosis Date   Anxiety    Concussion     Drug abuse (HCC)    Psychosis (HCC)    Shoulder dislocation    Substance abuse (HCC)     Surgical History: History reviewed. No pertinent surgical history.   Medications:   Current Facility-Administered Medications:    acetaminophen  (TYLENOL ) tablet 650 mg, 650 mg, Oral, Q6H PRN, Trish Furl, MD   ibuprofen (ADVIL) tablet 400 mg, 400 mg, Oral, Q6H PRN, Trish Furl, MD  Current Outpatient Medications:    busPIRone  (BUSPAR ) 5 MG tablet, Take 1 tablet (5 mg total) by mouth 2 (two) times daily., Disp: 60 tablet, Rfl: 1   cyclobenzaprine  (FLEXERIL ) 10 MG tablet, Take 1 tablet (10 mg total) by mouth 2 (two) times daily as needed for muscle spasms., Disp: 20 tablet, Rfl: 0   haloperidol  (HALDOL ) 10 MG tablet, Take 1 tablet (10 mg total) by mouth 2 (two) times daily., Disp: 60 tablet, Rfl: 1   meloxicam  (MOBIC ) 7.5 MG tablet, Take 1 tablet (7.5 mg total) by mouth daily., Disp: 5 tablet, Rfl: 0   naproxen  (NAPROSYN ) 500 MG tablet, Take 1 tablet (500 mg total) by mouth 2 (two) times daily with a meal., Disp: 60 tablet, Rfl: 1   ondansetron  (ZOFRAN  ODT) 8 MG disintegrating tablet, Take 1 tablet (8 mg total) by mouth every 8 (eight) hours as needed for nausea or vomiting., Disp: 10 tablet, Rfl: 0   pantoprazole  (PROTONIX ) 40 MG tablet, Take 1 tablet (40 mg total) by mouth daily for 30 days., Disp: 30 tablet, Rfl: 0   sertraline  (ZOLOFT ) 50 MG tablet, Take 1 tablet (50 mg total) by mouth daily., Disp: 30 tablet, Rfl: 1  Allergies: Allergies  Allergen Reactions   Tramadol      Upset stomach    Tramadol  Other (See Comments)    "Upsets the stomach"    Volanda Gruber, NP

## 2023-06-30 NOTE — ED Notes (Signed)
 Called patient.  No answer.

## 2023-06-30 NOTE — ED Provider Notes (Signed)
 [place] EMERGENCY DEPARTMENT AT [facility] Provider Note   CSN: [id number] Arrival date & time: 06/29/23  2106     History  Chief Complaint  Patient presents with   Rib Injury   Shortness of Breath    Jeff Yang is a 38 y.o. male.  The history is provided by medical records.  Shortness of Breath  38 year old male with history of polysubstance abuse, anxiety, schizoaffective disorder, presenting to the ED for reportedly trouble breathing.  Initially stated to triage RN that he felt like he was having a panic attack.  He reported "broken ribs".  Patient has been in the lobby for a few hours now.  He has been causing a disturbance while out there.  Somehow, has not passe front desk and was found sleeping in a room that was closed.  He is sleeping soundly, no distress.  Home Medications Prior to Admission medications   Medication Sig Start Date End Date Taking? Authorizing Provider  busPIRone  (BUSPAR ) 5 MG tablet Take 1 tablet (5 mg total) by mouth 2 (two) times daily. 04/02/23   Nwoko, Uchenna E, PA  cyclobenzaprine  (FLEXERIL ) 10 MG tablet Take 1 tablet (10 mg total) by mouth 2 (two) times daily as needed for muscle spasms. 06/29/17   Khatri, Hina, PA-C  haloperidol  (HALDOL ) 10 MG tablet Take 1 tablet (10 mg total) by mouth 2 (two) times daily. 04/02/23   Nwoko, Uchenna E, PA  meloxicam  (MOBIC ) 7.5 MG tablet Take 1 tablet (7.5 mg total) by mouth daily. 11/22/22   Palumbo, April, MD  naproxen  (NAPROSYN ) 500 MG tablet Take 1 tablet (500 mg total) by mouth 2 (two) times daily with a meal. 04/02/23   Nwoko, Uchenna E, PA  ondansetron  (ZOFRAN  ODT) 8 MG disintegrating tablet Take 1 tablet (8 mg total) by mouth every 8 (eight) hours as needed for nausea or vomiting. 09/28/14   Molpus, John, MD  pantoprazole  (PROTONIX ) 40 MG tablet Take 1 tablet (40 mg total) by mouth daily for 30 days. 03/14/18 04/13/18  Roberts Ching, MD  sertraline  (ZOLOFT ) 50 MG tablet Take 1 tablet (50 mg  total) by mouth daily. 04/02/23   Nwoko, Uchenna E, PA      Allergies    Tramadol  and Tramadol     Review of Systems   Review of Systems  Respiratory:  Positive for shortness of breath.   All other systems reviewed and are negative.   Physical Exam Updated Vital Signs BP (!) 157/130 (BP Location: Left Arm)   Pulse (!) 104   Temp 98 F (36.7 C) (Oral)   Resp 20   Ht 6' (1.829 m)   Wt 92.1 kg   SpO2 100%   BMI 27.53 kg/m   Physical Exam Vitals and nursing note reviewed.  Constitutional:      Comments: Sleeping soundly on stretcher, refusing to answer questions during exam  HENT:     Head: Normocephalic and atraumatic.  Eyes:     Conjunctiva/sclera: Conjunctivae normal.     Pupils: Pupils are equal, round, and reactive to light.  Cardiovascular:     Rate and Rhythm: Normal rate and regular rhythm.     Heart sounds: Normal heart sounds.  Pulmonary:     Effort: Pulmonary effort is normal.     Breath sounds: Normal breath sounds.     Comments: Breathing easily, no distress Abdominal:     General: Bowel sounds are normal.     Palpations: Abdomen is soft.  Musculoskeletal:  General: Normal range of motion.     Cervical back: Normal range of motion.  Skin:    General: Skin is warm and dry.  Neurological:     Mental Status: He is oriented to person, place, and time.     ED Results / Procedures / Treatments   Labs (all labs ordered are listed, but only abnormal results are displayed) Labs Reviewed  BASIC METABOLIC PANEL WITH GFR - Abnormal; Notable for the following components:      Result Value   Potassium 3.3 (*)    Glucose, Bld 117 (*)    All other components within normal limits  CBC - Abnormal; Notable for the following components:   RBC 3.74 (*)    Hemoglobin 11.6 (*)    HCT 34.3 (*)    All other components within normal limits  SALICYLATE LEVEL - Abnormal; Notable for the following components:   Salicylate Lvl <7.0 (*)    All other components  within normal limits  ACETAMINOPHEN  LEVEL - Abnormal; Notable for the following components:   Acetaminophen  (Tylenol ), Serum <10 (*)    All other components within normal limits  ETHANOL  RAPID URINE DRUG SCREEN, HOSP PERFORMED    EKG None  Radiology DG Chest Port 1 View Result Date: 06/29/2023 CLINICAL DATA:  Shortness of breath EXAM: PORTABLE CHEST 1 VIEW COMPARISON:  10/06/2017 and 06/28/2018 FINDINGS: The heart size and mediastinal contours are within normal limits. Both lungs are clear. The visualized skeletal structures are unremarkable. IMPRESSION: No active disease. Electronically Signed   By: Rozell Cornet M.D.   On: 06/29/2023 21:54    Procedures Procedures    Medications Ordered in ED Medications - No data to display  ED Course/ Medical Decision Making/ A&P                                 Medical Decision Making Amount and/or Complexity of Data Reviewed Labs: ordered. Radiology: ordered and independent interpretation performed. ECG/medicine tests: ordered and independent interpretation performed.   38 year old male here for shortness of breath.  Reportedly had anxiety but also stated "broken ribs.  I do not see any prior recent imaging indicative of such.  Patient has been causing a ruckus in the lobby for the past few hours.  Somehow managed to get back to treatment area and has been sleeping in a closed room.  Patient is sleeping soundly on ED stretcher.  He has no respiratory distress.  He refuses to talk to me or answer any questions when asked.  He did have x-ray performed which is reassuring without any pneumothorax or other traumatic findings.  Also had labs performed without leukocytosis or electrolyte derangement.  Ethanol is negative.  Tylenol  and salicylate are negative.  UDS is pending.  Patient does have known psychiatric disease, however was seen and evaluated by psychiatry yesterday, cleared from their standpoint.  They suspected substance-induced mood  disorder and malingering as he is currently homeless.  Suspect the same again today.  I do not see any indication for IVC or repeat emergent psychiatric evaluation today.  Stable for discharge.  Able to ambulate out of the department independently.  Final Clinical Impression(s) / ED Diagnoses Final diagnoses:  Malingering    Rx / DC Orders ED Discharge Orders     None         Coretha Dew, PA-C 06/30/23 0334    Iva Mariner, MD 06/30/23 [phone]

## 2023-06-30 NOTE — ED Notes (Signed)
 Per registration the patient walked in behind security and went somewhere in the hospital. Security is looking for the patient.

## 2023-06-30 NOTE — ED Notes (Signed)
 Pt found in room after unable to locate him in lobby. Pt was sleeping. Provider, Atlee Leach, PA,  at bedside. Pt refused to talk to her but was easily aroused by staff and ambulated without assistance.
